# Patient Record
Sex: Male | Born: 1955 | Race: White | Hispanic: No | Marital: Married | Smoking: Former smoker
Health system: Southern US, Community
[De-identification: ages and names within clinical notes are randomized; demographics above are authoritative.]

## PROBLEM LIST (undated history)

## (undated) DIAGNOSIS — J449 Chronic obstructive pulmonary disease, unspecified: Secondary | ICD-10-CM

## (undated) DIAGNOSIS — M5126 Other intervertebral disc displacement, lumbar region: Secondary | ICD-10-CM

## (undated) HISTORY — PX: ROTATOR CUFF REPAIR: SHX139

## (undated) HISTORY — DX: Chronic obstructive pulmonary disease, unspecified: J44.9

## (undated) HISTORY — PX: SPINAL FUSION: SHX223

## (undated) HISTORY — PX: HERNIA REPAIR: SHX51

## (undated) HISTORY — DX: Other intervertebral disc displacement, lumbar region: M51.26

---

## 2004-08-25 ENCOUNTER — Ambulatory Visit (HOSPITAL_COMMUNITY): Admission: RE | Admit: 2004-08-25 | Discharge: 2004-08-25 | Payer: Self-pay | Admitting: Family Medicine

## 2004-10-02 ENCOUNTER — Encounter: Admission: RE | Admit: 2004-10-02 | Discharge: 2004-10-02 | Payer: Self-pay | Admitting: Neurological Surgery

## 2004-12-31 ENCOUNTER — Ambulatory Visit: Payer: Self-pay | Admitting: Pain Medicine

## 2005-01-01 ENCOUNTER — Ambulatory Visit: Payer: Self-pay | Admitting: Pain Medicine

## 2005-02-03 ENCOUNTER — Ambulatory Visit: Payer: Self-pay | Admitting: Physician Assistant

## 2010-08-09 ENCOUNTER — Encounter: Payer: Self-pay | Admitting: Family Medicine

## 2012-10-27 ENCOUNTER — Encounter: Payer: Self-pay | Admitting: Nurse Practitioner

## 2012-10-27 ENCOUNTER — Ambulatory Visit (INDEPENDENT_AMBULATORY_CARE_PROVIDER_SITE_OTHER): Payer: Medicare PPO | Admitting: Nurse Practitioner

## 2012-10-27 VITALS — BP 145/70 | HR 81 | Temp 97.6°F | Ht 74.0 in | Wt 227.0 lb

## 2012-10-27 DIAGNOSIS — R220 Localized swelling, mass and lump, head: Secondary | ICD-10-CM

## 2012-10-27 DIAGNOSIS — R221 Localized swelling, mass and lump, neck: Secondary | ICD-10-CM

## 2012-10-27 NOTE — Patient Instructions (Signed)
Keep appointment for CT when made Will talk after get results

## 2012-10-27 NOTE — Progress Notes (Signed)
  Subjective:    Patient ID: Don Davis, male    DOB: 12-11-1955, 57 y.o.   MRN: [id number]  HPI- Patient in today c/o lump in his throat. Noticed it in December. Can't see it everyday. Seems to have gotten bigger. Starting to have trouble swallowing solid foods. Liquids are ok. No change in voice. Patient quit smoking in 2012, was up to atleast 1 pck a day.   Review of Systems  Constitutional: Negative.   HENT: Positive for trouble swallowing. Negative for sore throat, mouth sores and voice change.   Eyes: Negative.   Respiratory: Positive for cough. Negative for chest tightness, shortness of breath and stridor.   Cardiovascular: Negative.   Gastrointestinal: Negative.   Genitourinary: Negative.   Musculoskeletal: Negative.   Neurological: Negative.   Hematological: Negative.   Psychiatric/Behavioral: Negative.        Objective:   Physical Exam  Constitutional: He is oriented to person, place, and time. He appears well-developed and well-nourished.  HENT:  Mouth/Throat: Oral lesions (undermeath tonue on right side. 1cm nontender) present.  Eyes: Pupils are equal, round, and reactive to light.  Neck: No tracheal deviation present. No thyromegaly present.  3cm mobile tender mass mid lower anterior neck  Cardiovascular: Normal rate, normal heart sounds and intact distal pulses.   Pulmonary/Chest: Effort normal and breath sounds normal.  Lymphadenopathy:    He has no cervical adenopathy.  Neurological: He is alert and oriented to person, place, and time.  Skin: Skin is warm and dry.  Psychiatric: He has a normal mood and affect. His behavior is normal. Judgment and thought content normal.    BP 145/70  Pulse 81  Temp(Src) 97.6 F (36.4 C) (Oral)  Ht 6\' 2"  (1.88 m)  Wt 227 lb (102.967 kg)  BMI 29.13 kg/m2       Assessment & Plan:  Anterior mid lower neck mass  CT of chest with contrast  Will wait on CT results  Mary-Margaret Daphine Deutscher, FNP

## 2012-11-02 ENCOUNTER — Other Ambulatory Visit: Payer: Self-pay | Admitting: Nurse Practitioner

## 2012-11-02 ENCOUNTER — Telehealth: Payer: Self-pay

## 2012-11-02 DIAGNOSIS — Z139 Encounter for screening, unspecified: Secondary | ICD-10-CM

## 2012-11-02 NOTE — Telephone Encounter (Signed)
Ordered CT chest with for neck mass  Passed prior authorization through [facility] but Maryruth Bun is saying that diagnosis will not pass medical necessity  They need another diagnosis

## 2012-11-02 NOTE — Telephone Encounter (Signed)
Guess will have to order CT of neck mass and hopefully that will go through. We will still have to go back and do Chest CT  eventually

## 2012-11-03 ENCOUNTER — Other Ambulatory Visit: Payer: Self-pay | Admitting: Nurse Practitioner

## 2012-11-03 ENCOUNTER — Other Ambulatory Visit (INDEPENDENT_AMBULATORY_CARE_PROVIDER_SITE_OTHER): Payer: Medicare PPO

## 2012-11-03 DIAGNOSIS — Z139 Encounter for screening, unspecified: Secondary | ICD-10-CM

## 2012-11-03 LAB — BASIC METABOLIC PANEL WITH GFR
BUN: 12 mg/dL (ref 6–23)
Chloride: 101 mEq/L (ref 96–112)
Creat: 0.89 mg/dL (ref 0.50–1.35)

## 2012-11-07 ENCOUNTER — Encounter: Payer: Medicare PPO | Admitting: Family Medicine

## 2012-11-07 ENCOUNTER — Ambulatory Visit (INDEPENDENT_AMBULATORY_CARE_PROVIDER_SITE_OTHER): Payer: Medicare PPO | Admitting: Family Medicine

## 2012-11-07 ENCOUNTER — Encounter: Payer: Self-pay | Admitting: Family Medicine

## 2012-11-07 ENCOUNTER — Encounter: Payer: Self-pay | Admitting: *Deleted

## 2012-11-07 VITALS — BP 164/84 | HR 72 | Temp 98.5°F | Ht 72.0 in | Wt 235.0 lb

## 2012-11-07 DIAGNOSIS — H1010 Acute atopic conjunctivitis, unspecified eye: Secondary | ICD-10-CM

## 2012-11-07 DIAGNOSIS — Z20818 Contact with and (suspected) exposure to other bacterial communicable diseases: Secondary | ICD-10-CM

## 2012-11-07 DIAGNOSIS — J302 Other seasonal allergic rhinitis: Secondary | ICD-10-CM | POA: Insufficient documentation

## 2012-11-07 DIAGNOSIS — Z2089 Contact with and (suspected) exposure to other communicable diseases: Secondary | ICD-10-CM

## 2012-11-07 DIAGNOSIS — H1045 Other chronic allergic conjunctivitis: Secondary | ICD-10-CM

## 2012-11-07 DIAGNOSIS — J309 Allergic rhinitis, unspecified: Secondary | ICD-10-CM

## 2012-11-07 DIAGNOSIS — J441 Chronic obstructive pulmonary disease with (acute) exacerbation: Secondary | ICD-10-CM

## 2012-11-07 MED ORDER — AMOXICILLIN 500 MG PO CAPS: 500.0000 mg | ORAL_CAPSULE | Freq: Three times a day (TID) | ORAL | Status: DC

## 2012-11-07 MED ORDER — BUDESONIDE-FORMOTEROL FUMARATE 160-4.5 MCG/ACT IN AERO: 2.0000 | INHALATION_SPRAY | Freq: Two times a day (BID) | RESPIRATORY_TRACT | Status: DC

## 2012-11-07 MED ORDER — CETIRIZINE HCL 10 MG PO TABS: 10.0000 mg | ORAL_TABLET | Freq: Every day | ORAL | Status: DC

## 2012-11-07 MED ORDER — OLOPATADINE HCL 0.1 % OP SOLN: 1.0000 [drp] | Freq: Two times a day (BID) | OPHTHALMIC | Status: DC

## 2012-11-07 NOTE — Progress Notes (Signed)
This encounter was created in error - please disregard.

## 2012-11-07 NOTE — Patient Instructions (Addendum)

## 2012-11-07 NOTE — Progress Notes (Signed)
Patient ID: Don Davis, male   DOB: January 25, 1956, 57 y.o.   MRN: [id number] SUBJECTIVE: HPI: Congested for:       has had runny and itchy eyes. has had runny, itchy and stuffy nose. has had Sneezing as well. has had Coughing has had Wheezing  Medications used for this problem:none has not had effective response. Has a history of former smoking with COPD and used to use a symbicort inhaler but ran out. Wheezy but not in distress. He had his grand daughter here last week seen with Dx strept throat.  PMH/PSH: reviewed/updated in Epic  SH/FH: reviewed/updated in Epic  Allergies: reviewed/updated in Epic  Medications: reviewed/updated in Epic  Immunizations: reviewed/updated in Epic  ROS: As above in the HPI. All other systems are stable or negative.  OBJECTIVE: APPEARANCE:  Patient in no acute distress.The patient appeared well nourished and normally developed. Acyanotic. Waist:obese VITAL SIGNS:BP 164/84  Pulse 72  Temp(Src) 98.5 F (36.9 C) (Oral)  Ht 6' (1.829 m)  Wt 235 lb (106.595 kg)  BMI 31.86 kg/m2 congested  SKIN: warm and  Dry without overt rashes, tattoos and scars  HEAD and Neck: without JVD, Head and scalp: normal Eyes:No scleral icterus. Fundi normal, eye movements normal.lacrimating. injected Ears: Auricle normal, canal normal, Tympanic membranes normal, insufflation normal. Nose:rhinorrhea and boggy swollen mucosa.Throat: normal Neck & thyroid: anterior neck mass, he is scheduled for a neck CT. Throat very rted without exudates.  CHEST & LUNGS: Chest wall: normal Lungs:rhonchi and wheezes. No rales.  CVS: Reveals the PMI to be normally located. Regular rhythm, First and Second Heart sounds are normal,  absence of murmurs, rubs or gallops. Peripheral vasculature: Radial pulses: normal Dorsal pedis pulses: normal Posterior pulses: normal  ABDOMEN:  Appearance: normal Benign,, no organomegaly, no masses, no Abdominal Aortic enlargement. No  Guarding , no rebound. No Bruits. Bowel sounds: normal  RECTAL: N/A GU: N/A  EXTREMETIES: nonedematous. Both Femoral and Pedal pulses are normal.  NEUROLOGIC: oriented to time,place and person; nonfocal.l.  ASSESSMENT: COPD exacerbation - Plan: budesonide-formoterol (SYMBICORT) 160-4.5 MCG/ACT inhaler  Seasonal allergic rhinitis - Plan: cetirizine (ZYRTEC) 10 MG tablet  Seasonal allergic conjunctivitis - Plan: olopatadine (PATANOL) 0.1 % ophthalmic solution  Strep throat exposure - Plan: amoxicillin (AMOXIL) 500 MG capsule    PLAN: No orders of the defined types were placed in this encounter.   No results found for this or any previous visit (from the past 24 hour(s)). Meds ordered this encounter  Medications  . cetirizine (ZYRTEC) 10 MG tablet    Sig: Take 1 tablet (10 mg total) by mouth daily.    Dispense:  30 tablet    Refill:  5  . olopatadine (PATANOL) 0.1 % ophthalmic solution    Sig: Place 1 drop into both eyes 2 (two) times daily.    Dispense:  5 mL    Refill:  1  . budesonide-formoterol (SYMBICORT) 160-4.5 MCG/ACT inhaler    Sig: Inhale 2 puffs into the lungs 2 (two) times daily.    Dispense:  1 Inhaler    Refill:  3  . amoxicillin (AMOXIL) 500 MG capsule    Sig: Take 1 capsule (500 mg total) by mouth 3 (three) times daily.    Dispense:  30 capsule    Refill:  0  avoid cigarette smoke. Saline gargles. Demonstrate use of the inhaler. Hand out on COPD. RTC in 4 weeks.  Madysin Crisp P. [place] Charon, M.D.

## 2012-11-08 NOTE — Progress Notes (Signed)
Patient came in for labs only.

## 2012-11-09 ENCOUNTER — Other Ambulatory Visit: Payer: Self-pay

## 2012-11-09 DIAGNOSIS — R221 Localized swelling, mass and lump, neck: Secondary | ICD-10-CM

## 2012-11-11 ENCOUNTER — Telehealth: Payer: Self-pay | Admitting: Nurse Practitioner

## 2012-11-11 NOTE — Telephone Encounter (Signed)
Patient aware not available yet will call when we can get it. PLEASE TRY AND GET REPORT THANK YOU?

## 2012-11-11 NOTE — Telephone Encounter (Signed)
Report not available yet. Have radilogy check on status. Let patient know will call as soon as get report

## 2012-11-11 NOTE — Telephone Encounter (Signed)
Please advise 

## 2012-11-16 NOTE — Telephone Encounter (Signed)
CT scan did not show [place] what looking for so we will schedule U/s of neck.

## 2012-11-16 NOTE — Telephone Encounter (Signed)
PT AWARE OF RESULTS AND AWARE THAT ULTRA SOUND IS BEING SCHEDULED.

## 2012-12-01 ENCOUNTER — Telehealth: Payer: Self-pay

## 2012-12-01 ENCOUNTER — Other Ambulatory Visit: Payer: Self-pay | Admitting: Nurse Practitioner

## 2012-12-01 DIAGNOSIS — R221 Localized swelling, mass and lump, neck: Secondary | ICD-10-CM

## 2012-12-01 NOTE — Telephone Encounter (Signed)
Says he is waiting on a referral for an ultrasound    nothing in referral workqueue

## 2012-12-06 ENCOUNTER — Other Ambulatory Visit: Payer: Self-pay | Admitting: Nurse Practitioner

## 2012-12-06 ENCOUNTER — Telehealth: Payer: Self-pay | Admitting: Nurse Practitioner

## 2012-12-06 ENCOUNTER — Ambulatory Visit (HOSPITAL_COMMUNITY)
Admission: RE | Admit: 2012-12-06 | Discharge: 2012-12-06 | Disposition: A | Discharge status: Home / Self Care | Payer: Medicare PPO | Source: Ambulatory Visit | Attending: Nurse Practitioner | Admitting: Nurse Practitioner

## 2012-12-06 DIAGNOSIS — E041 Nontoxic single thyroid nodule: Secondary | ICD-10-CM

## 2012-12-06 DIAGNOSIS — R221 Localized swelling, mass and lump, neck: Secondary | ICD-10-CM

## 2012-12-06 DIAGNOSIS — E049 Nontoxic goiter, unspecified: Secondary | ICD-10-CM | POA: Insufficient documentation

## 2012-12-06 DIAGNOSIS — R220 Localized swelling, mass and lump, head: Secondary | ICD-10-CM | POA: Insufficient documentation

## 2012-12-06 NOTE — Telephone Encounter (Signed)
LMOM, Note in chart for procedure says guided needle placement.

## 2012-12-08 ENCOUNTER — Ambulatory Visit (HOSPITAL_COMMUNITY)
Admission: RE | Admit: 2012-12-08 | Discharge: 2012-12-08 | Disposition: A | Discharge status: Home / Self Care | Payer: Medicare PPO | Source: Ambulatory Visit | Attending: Nurse Practitioner | Admitting: Nurse Practitioner

## 2012-12-08 DIAGNOSIS — E041 Nontoxic single thyroid nodule: Secondary | ICD-10-CM

## 2012-12-08 MED ORDER — LIDOCAINE HCL (PF) 2 % IJ SOLN
INTRAMUSCULAR | Status: AC
  Filled 2012-12-08: qty 10

## 2012-12-08 NOTE — Progress Notes (Signed)
Lidocaine 2%           2mL injected                 Right thyroid biopsies performed

## 2012-12-09 ENCOUNTER — Ambulatory Visit: Payer: Medicare PPO | Admitting: Family Medicine

## 2012-12-09 NOTE — Telephone Encounter (Signed)
Pt aware biopsy report not back yet

## 2012-12-13 ENCOUNTER — Telehealth: Payer: Self-pay | Admitting: Nurse Practitioner

## 2012-12-13 NOTE — Telephone Encounter (Signed)
Pt aware that pathology report not back yet  

## 2012-12-15 ENCOUNTER — Telehealth: Payer: Self-pay | Admitting: Nurse Practitioner

## 2012-12-15 NOTE — Telephone Encounter (Signed)
Pt aware that pathology report not back yet

## 2012-12-19 ENCOUNTER — Telehealth: Payer: Self-pay | Admitting: Nurse Practitioner

## 2012-12-19 DIAGNOSIS — E049 Nontoxic goiter, unspecified: Secondary | ICD-10-CM

## 2012-12-19 NOTE — Telephone Encounter (Signed)
Non-cancerous goiter- Will do endocrinology referral

## 2012-12-20 NOTE — Telephone Encounter (Signed)
Patient notified of results.

## 2013-05-25 ENCOUNTER — Other Ambulatory Visit: Payer: Self-pay

## 2014-06-21 ENCOUNTER — Telehealth: Payer: Self-pay | Admitting: Family Medicine

## 2015-05-22 ENCOUNTER — Encounter: Payer: Self-pay | Admitting: Pediatrics

## 2015-05-22 ENCOUNTER — Ambulatory Visit (INDEPENDENT_AMBULATORY_CARE_PROVIDER_SITE_OTHER): Payer: Medicare PPO | Admitting: Pediatrics

## 2015-05-22 VITALS — BP 141/81 | HR 86 | Temp 97.3°F | Ht 72.0 in | Wt 235.0 lb

## 2015-05-22 DIAGNOSIS — M545 Low back pain, unspecified: Secondary | ICD-10-CM | POA: Insufficient documentation

## 2015-05-22 DIAGNOSIS — R030 Elevated blood-pressure reading, without diagnosis of hypertension: Secondary | ICD-10-CM | POA: Diagnosis not present

## 2015-05-22 DIAGNOSIS — J449 Chronic obstructive pulmonary disease, unspecified: Secondary | ICD-10-CM | POA: Insufficient documentation

## 2015-05-22 DIAGNOSIS — B029 Zoster without complications: Secondary | ICD-10-CM | POA: Insufficient documentation

## 2015-05-22 DIAGNOSIS — R1012 Left upper quadrant pain: Secondary | ICD-10-CM | POA: Insufficient documentation

## 2015-05-22 DIAGNOSIS — Z720 Tobacco use: Secondary | ICD-10-CM | POA: Diagnosis not present

## 2015-05-22 DIAGNOSIS — IMO0001 Reserved for inherently not codable concepts without codable children: Secondary | ICD-10-CM | POA: Insufficient documentation

## 2015-05-22 LAB — POCT URINALYSIS DIPSTICK
BILIRUBIN UA: NEGATIVE
GLUCOSE UA: NEGATIVE
KETONES UA: NEGATIVE
LEUKOCYTES UA: NEGATIVE
Nitrite, UA: NEGATIVE
Spec Grav, UA: >=1.03
Urobilinogen, UA: NEGATIVE
pH, UA: 5

## 2015-05-22 LAB — POCT UA - MICROSCOPIC ONLY
Casts, Ur, LPF, POC: NEGATIVE
Crystals, Ur, HPF, POC: NEGATIVE
MUCUS UA: NEGATIVE
RBC, URINE, MICROSCOPIC: NEGATIVE
YEAST UA: NEGATIVE

## 2015-05-22 MED ORDER — BUDESONIDE-FORMOTEROL FUMARATE 160-4.5 MCG/ACT IN AERO: 2.0000 | INHALATION_SPRAY | Freq: Two times a day (BID) | RESPIRATORY_TRACT | Status: DC

## 2015-05-22 MED ORDER — VALACYCLOVIR HCL 1 G PO TABS: 1000.0000 mg | ORAL_TABLET | Freq: Three times a day (TID) | ORAL | Status: DC

## 2015-05-22 MED ORDER — NAPROXEN DR 500 MG PO TBEC: 500.0000 mg | DELAYED_RELEASE_TABLET | Freq: Two times a day (BID) | ORAL | Status: DC | PRN

## 2015-05-22 MED ORDER — ALBUTEROL SULFATE HFA 108 (90 BASE) MCG/ACT IN AERS: 2.0000 | INHALATION_SPRAY | Freq: Four times a day (QID) | RESPIRATORY_TRACT | Status: AC | PRN

## 2015-05-22 NOTE — Progress Notes (Signed)
Subjective:    Patient ID: Don Davis, male    DOB: 07-Mar-1956, 59 y.o.   MRN: [id number]  CC: abd pain, back pain, rash  HPI: Don Davis is a 59 y.o. male presenting on 05/22/2015 for Rash  Seen in the ED 4 days ago for LLQ/LUQ pain that started apprx 1 week ago after lifting a heavy hitch and feeling like he hurt his back again at the time. ED gave him miralax for constipation they saw on xray. Had clear stools after taking it. No change in abdominal pain. Pain goes away at times, he can make pain come back by bending over and overusing/stretching his back. Naproxen helps with pain.  Rash started 2 days ago on skin over LLQ. Hurts and burns. Has "water blisters" on the rash. No history of shingles. No dysuria. H/o kidney stones years ago, daughter also with kidney stones. No fevers Normal appetite, pain not worsened with eating Normal stooling since stopping the miralax, had two this morning. No CP, no SOB. Current smoker.  Relevant past medical, surgical, family and social history reviewed and updated as indicated. Interim medical history since our last visit reviewed. Allergies and medications reviewed and updated.   ROS: Per HPI unless specifically indicated above  Past Medical History Patient Active Problem List   Diagnosis Date Noted  . COPD exacerbation ([place]) 11/07/2012  . Seasonal allergic rhinitis 11/07/2012  . Seasonal allergic conjunctivitis 11/07/2012    Current Outpatient Prescriptions  Medication Sig Dispense Refill  . budesonide-formoterol (SYMBICORT) 160-4.5 MCG/ACT inhaler Inhale 2 puffs into the lungs 2 (two) times daily. 1 Inhaler 3  . NAPROXEN DR 500 MG EC tablet Take 1 tablet (500 mg total) by mouth 2 (two) times daily as needed. 30 tablet 0  . albuterol (PROVENTIL HFA;VENTOLIN HFA) 108 (90 BASE) MCG/ACT inhaler Inhale 2 puffs into the lungs every 6 (six) hours as needed for wheezing or shortness of breath. 1 Inhaler 2  . GAVILYTE-G 236 G  solution take as directed by prescriber  0  . valACYclovir (VALTREX) 1000 MG tablet Take 1 tablet (1,000 mg total) by mouth 3 (three) times daily. 21 tablet 0   No current facility-administered medications for this visit.       Objective:    BP 141/81 mmHg  Pulse 86  Temp(Src) 97.3 F (36.3 C) (Oral)  Ht 6' (1.829 m)  Wt 235 lb (106.595 kg)  BMI 31.86 kg/m2  Wt Readings from Last 3 Encounters:  05/22/15 235 lb (106.595 kg)  11/07/12 235 lb (106.595 kg)  11/07/12 235 lb (106.595 kg)    Gen: NAD, alert, cooperative with exam, NCAT EYES: EOMI, no scleral injection or icterus ENT:  TMs pearly gray b/l, OP without erythema LYMPH: no cervical LAD CV: NRRR, normal S1/S2, no murmur, distal pulses 2+ b/l Resp: CTABL, no wheezes, normal WOB Abd: +BS, soft, mildly tender LUQ of abdomen over rash, mildly distended, no guarding or organomegaly Ext: No edema, warm Neuro: Alert and oriented, strength equal b/l UE and LE, coordination grossly normal MSK: normal muscle bulk Skin: 7cm by 3cm red plaque with clear-fluid filled vesicles present just to L side of spine. Two additional smaller but similar plaques along same latitude of abdomen in mid-axillary line and anterior L side of abdomen. Small amount of redness present as well. No open sores, no excoriations.     Assessment & Plan:   Justinian was seen today for rash and follow up multiple medical complaints. Suspect that  back pain from chronic LBP is separate issue from painful shingles rash on skin. Pain improves with naprosyn. Normal eating/bowel habits now. Ddx broad but includes diverticulitis, kidney stone. Pt with no fevers, otherwise feeling well. Difficult to separate abd pain from shingles pain as is in the same location and may be the same thing. Will treat shingles as within 72 h of start of symptoms, also pt's wife with MS likely on immunosuppressants though husband not entirely sure. Discussed safety precautions around wife. Also  repeat UA, negative in ED per pt report. Return precautions givne. BP elevated today, will recheck next visit in 4 weeks. Pt needs refills on symbicort and albuterol for COPD. Symptoms currently well-controlled. Current smoker, not interested in quitting at this time. Continue naprosyn BID for back pain as needed.  Diagnoses and all orders for this visit:  Left upper quadrant pain -     POCT UA - Microscopic Only -     POCT urinalysis dipstick  Shingles -     valACYclovir (VALTREX) 1000 MG tablet; Take 1 tablet (1,000 mg total) by mouth 3 (three) times daily.  Chronic obstructive pulmonary disease, unspecified COPD type (HCC) -     budesonide-formoterol (SYMBICORT) 160-4.5 MCG/ACT inhaler; Inhale 2 puffs into the lungs 2 (two) times daily. -     albuterol (PROVENTIL HFA;VENTOLIN HFA) 108 (90 BASE) MCG/ACT inhaler; Inhale 2 puffs into the lungs every 6 (six) hours as needed for wheezing or shortness of breath.  Elevated blood pressure  Left-sided low back pain without sciatica  -     NAPROXEN DR 500 MG EC tablet; Take 1 tablet (500 mg total) by mouth 2 (two) times daily as needed.  Tobacco abuse  Follow up plan: Return in about 4 weeks (around 06/19/2015).  Assunta Found, MD [facility] 05/22/2015, 8:32 AM

## 2015-06-19 ENCOUNTER — Ambulatory Visit: Payer: Medicare PPO | Admitting: Pediatrics

## 2015-06-20 ENCOUNTER — Ambulatory Visit: Payer: Medicare PPO | Admitting: Pediatrics

## 2015-06-21 ENCOUNTER — Encounter: Payer: Self-pay | Admitting: Nurse Practitioner

## 2015-07-11 ENCOUNTER — Encounter: Payer: Self-pay | Admitting: Family Medicine

## 2015-07-11 ENCOUNTER — Ambulatory Visit (INDEPENDENT_AMBULATORY_CARE_PROVIDER_SITE_OTHER): Payer: Medicare PPO | Admitting: Family Medicine

## 2015-07-11 ENCOUNTER — Ambulatory Visit (INDEPENDENT_AMBULATORY_CARE_PROVIDER_SITE_OTHER): Payer: Medicare PPO

## 2015-07-11 DIAGNOSIS — M25551 Pain in right hip: Secondary | ICD-10-CM

## 2015-07-11 DIAGNOSIS — M25561 Pain in right knee: Secondary | ICD-10-CM

## 2015-07-11 MED ORDER — HYDROCODONE-ACETAMINOPHEN 10-325 MG PO TABS: 1.0000 | ORAL_TABLET | Freq: Three times a day (TID) | ORAL | Status: DC | PRN

## 2015-07-11 MED ORDER — CYCLOBENZAPRINE HCL 5 MG PO TABS: 5.0000 mg | ORAL_TABLET | Freq: Three times a day (TID) | ORAL | Status: DC | PRN

## 2015-07-11 MED ORDER — NAPROXEN 500 MG PO TBEC
500.0000 mg | DELAYED_RELEASE_TABLET | Freq: Two times a day (BID) | ORAL | Status: DC
Start: 2015-07-11 — End: 2017-02-19

## 2015-07-11 NOTE — Patient Instructions (Signed)
   Great to meet you!  Take naproxen twice daily for 5-7 days Use flexeril 3 times daily as needed  Hydrocodone is a potent pain medication, do not drive after you take it.  It is reserved for severe pain  Use ice for the first 24-36 hours, then heat is best  come back in 2-3 weeks
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
## 2015-08-05 ENCOUNTER — Telehealth: Payer: Self-pay | Admitting: Nurse Practitioner

## 2015-09-03 ENCOUNTER — Ambulatory Visit (INDEPENDENT_AMBULATORY_CARE_PROVIDER_SITE_OTHER): Payer: Medicare PPO

## 2015-09-03 ENCOUNTER — Ambulatory Visit (INDEPENDENT_AMBULATORY_CARE_PROVIDER_SITE_OTHER): Payer: Medicare PPO | Admitting: Family Medicine

## 2015-09-03 ENCOUNTER — Encounter: Payer: Self-pay | Admitting: Family Medicine

## 2015-09-03 VITALS — BP 147/77 | HR 91 | Temp 97.3°F | Ht 73.83 in | Wt 244.8 lb

## 2015-09-03 DIAGNOSIS — H66001 Acute suppurative otitis media without spontaneous rupture of ear drum, right ear: Secondary | ICD-10-CM | POA: Diagnosis not present

## 2015-09-03 DIAGNOSIS — M25551 Pain in right hip: Secondary | ICD-10-CM | POA: Diagnosis not present

## 2015-09-03 DIAGNOSIS — Z720 Tobacco use: Secondary | ICD-10-CM | POA: Diagnosis not present

## 2015-09-03 DIAGNOSIS — H66009 Acute suppurative otitis media without spontaneous rupture of ear drum, unspecified ear: Secondary | ICD-10-CM | POA: Insufficient documentation

## 2015-09-03 MED ORDER — CYCLOBENZAPRINE HCL 5 MG PO TABS: 5.0000 mg | ORAL_TABLET | Freq: Three times a day (TID) | ORAL | Status: DC | PRN

## 2015-09-03 MED ORDER — HYDROCODONE-ACETAMINOPHEN 7.5-325 MG PO TABS: 1.0000 | ORAL_TABLET | Freq: Four times a day (QID) | ORAL | Status: DC | PRN

## 2015-09-03 MED ORDER — AMOXICILLIN 875 MG PO TABS: 875.0000 mg | ORAL_TABLET | Freq: Two times a day (BID) | ORAL | Status: DC
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
## 2015-09-03 NOTE — Patient Instructions (Signed)
Great to meet you!  Use the Hydrocodone sparingly  We will call Thursday morning to set up an appt with Dr. Wynelle Link

## 2015-09-18 ENCOUNTER — Ambulatory Visit (INDEPENDENT_AMBULATORY_CARE_PROVIDER_SITE_OTHER): Payer: Medicare PPO | Admitting: Family Medicine

## 2015-09-18 ENCOUNTER — Encounter: Payer: Self-pay | Admitting: Family Medicine

## 2015-09-18 VITALS — BP 139/86 | HR 85 | Temp 97.8°F | Ht 73.83 in | Wt 245.6 lb

## 2015-09-18 DIAGNOSIS — R079 Chest pain, unspecified: Secondary | ICD-10-CM

## 2015-09-18 MED ORDER — GI COCKTAIL ~~LOC~~
30.0000 mL | Freq: Once | ORAL | Status: AC
  Administered 2015-09-18: 30 mL via ORAL

## 2015-09-18 MED ORDER — CEFDINIR 300 MG PO CAPS: 300.0000 mg | ORAL_CAPSULE | Freq: Two times a day (BID) | ORAL | Status: DC

## 2015-09-18 NOTE — Progress Notes (Signed)
   HPI  Patient presents today here for continued ear pain.  On the way into the clinic he developed burning upper abdominal pain and upper chest pain. He denies any shortness of breath,. He's been given a GI cocktail and his symptoms are beginning to resolve. He states at home he takes Alka-Seltzer for similar pain and it goes away. Non radiating No palps Non exertional  Right ear pain He was seen about 2 weeks ago for right ear pain and treated with amoxicillin for acute otitis media He did have good improvement that has had return of symptoms with right-sided sharp ear pain intermittently. He's also had unusual popping is years.    PMH: Smoking status noted ROS: Per HPI  Objective: BP 139/86 mmHg  Pulse 85  Temp(Src) 97.8 F (36.6 C) (Oral)  Ht 6' 1.83" (1.875 m)  Wt 245 lb 9.6 oz (111.403 kg)  BMI 31.69 kg/m2 Gen: NAD, alert, cooperative with exam HEENT: NCAT, right TM with small red scab at the base, cement surrounding erythema as well, left TM is more erythematous but still has landmarks with thick yellow refill above the ossicles CV: RRR, good S1/S2, no murmur Resp: CTABL, no wheezes, non-labored Ext: No edema, warm Neuro: Alert and oriented, No gross deficits  EKG normal sinus rhythm  Assessment and plan:  # Chest pain, GERD Epigastric abdominal pain, burning in nature Resolved with GI cocktail Likely GERD related symptoms, EKG normal today  # Otitis media, right-sided symptoms, left-sided findings Treat with Omnicef considering recent amoxicillin does Discussed possible ENT referral, he will return if not improved in 2 weeks   Laroy Apple, MD [facility] 09/18/2015, 5:27 PM

## 2015-09-18 NOTE — Patient Instructions (Signed)
Great to see you!  Take all antibiotics  Come back in 2 weeks if not better, sooner if you need [place] for any reason.   Lets plan to see you for a routine exam in 2-3 months

## 2015-09-18 NOTE — Addendum Note (Signed)
Addended by: Karle Plumber on: 09/18/2015 05:45 PM   Modules accepted: Orders

## 2015-10-21 ENCOUNTER — Telehealth: Payer: Self-pay | Admitting: Family Medicine

## 2015-10-21 MED ORDER — IVERMECTIN 0.5 % EX LOTN: TOPICAL_LOTION | CUTANEOUS | Status: DC

## 2015-10-21 NOTE — Telephone Encounter (Signed)
Patient aware.

## 2015-10-21 NOTE — Telephone Encounter (Signed)
Sent Sklice for lice.   Laroy Apple, MD [facility] 10/21/2015, 12:31 PM

## 2015-10-24 ENCOUNTER — Telehealth: Payer: Self-pay

## 2015-10-24 MED ORDER — PERMETHRIN 1 % EX LOTN: TOPICAL_LOTION | CUTANEOUS | Status: DC

## 2015-10-24 NOTE — Telephone Encounter (Signed)
Rx changed  Laroy Apple, MD [facility] 10/24/2015, 12:08 PM

## 2015-10-24 NOTE — Telephone Encounter (Signed)
Insurance denied [employer]  Preferred are Permethrin topical cream and malathion lotion

## 2016-06-16 ENCOUNTER — Ambulatory Visit (INDEPENDENT_AMBULATORY_CARE_PROVIDER_SITE_OTHER): Payer: Medicare PPO | Admitting: Family Medicine

## 2016-06-16 ENCOUNTER — Encounter: Payer: Self-pay | Admitting: Family Medicine

## 2016-06-16 VITALS — BP 151/91 | HR 93 | Temp 97.6°F | Ht 73.83 in | Wt 235.6 lb

## 2016-06-16 DIAGNOSIS — E042 Nontoxic multinodular goiter: Secondary | ICD-10-CM

## 2016-06-16 DIAGNOSIS — M542 Cervicalgia: Secondary | ICD-10-CM

## 2016-06-16 DIAGNOSIS — R911 Solitary pulmonary nodule: Secondary | ICD-10-CM

## 2016-06-16 DIAGNOSIS — N889 Noninflammatory disorder of cervix uteri, unspecified: Secondary | ICD-10-CM | POA: Insufficient documentation

## 2016-06-16 MED ORDER — CYCLOBENZAPRINE HCL 5 MG PO TABS: 5.0000 mg | ORAL_TABLET | Freq: Three times a day (TID) | ORAL | 1 refills | Status: DC | PRN

## 2016-06-16 MED ORDER — METHYLPREDNISOLONE ACETATE 80 MG/ML IJ SUSP
80.0000 mg | Freq: Once | INTRAMUSCULAR | Status: AC
  Administered 2016-06-16: 80 mg via INTRAMUSCULAR

## 2016-06-16 MED ORDER — HYDROCODONE-ACETAMINOPHEN 7.5-325 MG PO TABS: 1.0000 | ORAL_TABLET | Freq: Four times a day (QID) | ORAL | 0 refills | Status: DC | PRN

## 2016-06-16 NOTE — Patient Instructions (Signed)
Great to see you!  For neck pain and the bone "lucency" on your CT scan I have ordered an MRI. We will refer you to neurosurgery as warranted   For the pulmonary nodule, you need a repeat CT scan of the chest in 12 month to be sure it is not changing like cancer  For the thyroid nodule you either need an [place] ([facility] or [facility]) or follow up at the [place] [facility] to be sure they can compare the findings to your previous tests

## 2016-06-16 NOTE — Progress Notes (Signed)
HPI  Patient presents today with left neck pain.  Patient explains these have problems with neck pain off and on for years. He was told by neurosurgeon, Dr. Carloyn Manner, previously that he needed a spinal fusion of the cervical spine. He previously was not having as many problems and so has declined this up until this point. Over the last 5 days he's had severe left-sided neck pain with radiation down to his left fingertips and numbness and tingling of his left arm. He has preserved strength and preserved function of the left arm.  He was seen at the [facility] yesterday and his heart was evaluated with a EKG, this was reported to be totally normal. His symptoms are not exertional, he has worsening symptoms with looking to the left or bending his neck.  Patient is requesting narcotic pain medications Prednisone does help his pain in general.  He would like a referrla to a neurosurgeon    PMH: Smoking status noted ROS: Per HPI  Objective: BP (!) 151/91   Pulse 93   Temp 97.6 F (36.4 C) (Oral)   Ht 6' 1.83" (1.875 m)   Wt 235 lb 9.6 oz (106.9 kg)   BMI 30.39 kg/m  Gen: NAD, alert, cooperative with exam HEENT: NCAT, EOMI, PERRL CV: RRR, good S1/S2, no murmur Resp: CTABL, no wheezes, non-labored Abd: SNTND, BS present, no guarding or organomegaly Ext: No edema, warm Neuro: Alert and oriented, No gross deficits  Has a CT of the mandible and cervical spine from a recent visit to the [place], collected on 06/02/2016. This shows 1. Symmetric prominence/mild enlargement of the bilateral parotid glands and submandibular glands. This may be secondary to normal anatomic variation versus reactive inflammatory change, no cervical mass or lesion, no cervical lymphadenopathy 2. Pulmonary emphysema and a small 3 mm left apical pulmonary nodule identified. Follow-up dedicated thoracic CT suggested in 12 months 3. Small nonspecific lucent focus of the C7 vertebral body. Correlate with dedicated cervical spine  MRI and for symptoms of pain in the lower cervical spine. 4. Bilateral thyroid nodules with soft tissue extension of the upper right thyroid lobe. Consider dedicated thyroid ultrasound if not previously obtained 5. Lucency surrounds what appears to be fragment of the left mandibular tooth. Periapical dental cyst or abscess not excluded  Assessment and plan:  # Cervical lesion, acute on chronic left-sided neck pain Patient with severe radiculopathy IM Depo-Medrol given, 30 pills of 7.5 mg hydrocodone also given MRI of the neck to investigate further both radiculopathy and cervical lucency Refer to neurosurgery pending results  # Olivary pulmonary nodule Left-sided 3 mm apical nodule, discussed with patient the need for repeat CT scan in 12 months, he will be sure to [facility].  # Thyroid nodules Previous thyroid ultrasound and biopsy in 2014 diagnosed with nonneoplastic goiter Considering extension of the right upper thyroid I would recommend repeat ultrasound when the dust settles from this neck issue here and he is also undergoing an MRI which could characterize the thyroid gland incidentally. recommended follow-up with myself or the [place].     Orders Placed This Encounter  Procedures  . MR Cervical Spine Wo Contrast    Standing Status:   Future    Standing Expiration Date:   08/16/2017    Order Specific Question:   Reason for Exam (SYMPTOM  OR DIAGNOSIS REQUIRED)    Answer:   acute on chronic neck pain with L sided radiculopathy, C7 Bone lucency  on recent CT    Order Specific Question:   Preferred imaging location?    Answer:   [facility] (table limit-350lbs)    Order Specific Question:   What is the patient's sedation requirement?    Answer:   No Sedation    Order Specific Question:   Does the patient have a pacemaker or implanted devices?    Answer:   No    Meds ordered this encounter  Medications  . HYDROcodone-acetaminophen  (NORCO) 7.5-325 MG tablet    Sig: Take 1 tablet by mouth every 6 (six) hours as needed for moderate pain.    Dispense:  30 tablet    Refill:  0  . cyclobenzaprine (FLEXERIL) 5 MG tablet    Sig: Take 1 tablet (5 mg total) by mouth 3 (three) times daily as needed for muscle spasms.    Dispense:  60 tablet    Refill:  [place], MD [facility] 06/16/2016, 1:24 PM

## 2016-06-18 ENCOUNTER — Telehealth: Payer: Self-pay | Admitting: Family Medicine

## 2016-06-18 NOTE — Telephone Encounter (Signed)
Spoke with pt regarding MRI Waiting for approval from [facility]

## 2016-06-24 ENCOUNTER — Ambulatory Visit (HOSPITAL_COMMUNITY)
Admission: RE | Admit: 2016-06-24 | Discharge: 2016-06-24 | Disposition: A | Discharge status: Home / Self Care | Payer: Medicare PPO | Source: Ambulatory Visit | Attending: Family Medicine | Admitting: Family Medicine

## 2016-06-24 DIAGNOSIS — M50322 Other cervical disc degeneration at C5-C6 level: Secondary | ICD-10-CM | POA: Diagnosis not present

## 2016-06-24 DIAGNOSIS — M542 Cervicalgia: Secondary | ICD-10-CM | POA: Diagnosis present

## 2016-06-24 DIAGNOSIS — N889 Noninflammatory disorder of cervix uteri, unspecified: Secondary | ICD-10-CM

## 2016-06-24 DIAGNOSIS — M4802 Spinal stenosis, cervical region: Secondary | ICD-10-CM | POA: Insufficient documentation

## 2016-06-25 ENCOUNTER — Ambulatory Visit (INDEPENDENT_AMBULATORY_CARE_PROVIDER_SITE_OTHER): Payer: Medicare PPO | Admitting: Family Medicine

## 2016-06-25 ENCOUNTER — Encounter: Payer: Self-pay | Admitting: Family Medicine

## 2016-06-25 VITALS — BP 145/87 | HR 86 | Temp 99.0°F | Ht 73.83 in | Wt 235.8 lb

## 2016-06-25 DIAGNOSIS — M9981 Other biomechanical lesions of cervical region: Secondary | ICD-10-CM

## 2016-06-25 DIAGNOSIS — M4802 Spinal stenosis, cervical region: Secondary | ICD-10-CM

## 2016-06-25 MED ORDER — HYDROCODONE-ACETAMINOPHEN 10-325 MG PO TABS: 1.0000 | ORAL_TABLET | Freq: Three times a day (TID) | ORAL | 0 refills | Status: DC | PRN

## 2016-06-25 NOTE — Telephone Encounter (Signed)
MRI completed.

## 2016-06-25 NOTE — Progress Notes (Signed)
   HPI  Patient presents today  for lip pain.  Patient explains that he has severe left-sided neck pain that radiates from his left neck to his left hand. The pain runs along his extensor surface of his forearm down to his thumb and second and third digit.  He has normal strength for his report. He would like to consider surgery and request referral.  7.5 mg hydrocodone are helping his pain but he feels like 10 mg very much better for him.   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     Orders Placed This Encounter  Procedures  . Ambulatory referral to Neurosurgery    Referral Priority:   Routine    Referral Type:   Surgical    Referral Reason:   Specialty Services Required    Requested Specialty:   Neurosurgery    Number of Visits Requested:   1    Meds ordered this encounter  Medications  . HYDROcodone-acetaminophen (NORCO) 10-325 MG tablet    Sig: Take 1 tablet by mouth every 8 (eight) hours as needed.    Dispense:  30 tablet    Refill:  0    Laroy Apple, MD [facility] 06/25/2016, 8:10 AM

## 2016-06-25 NOTE — Patient Instructions (Signed)
Great to see you!  We will call with an appointment

## 2016-07-28 ENCOUNTER — Ambulatory Visit (INDEPENDENT_AMBULATORY_CARE_PROVIDER_SITE_OTHER): Payer: Medicare HMO | Admitting: Family Medicine

## 2016-07-28 ENCOUNTER — Encounter: Payer: Self-pay | Admitting: Family Medicine

## 2016-07-28 VITALS — BP 140/80 | HR 89 | Temp 97.0°F | Ht 73.83 in | Wt 238.6 lb

## 2016-07-28 DIAGNOSIS — M542 Cervicalgia: Secondary | ICD-10-CM | POA: Diagnosis not present

## 2016-07-28 MED ORDER — HYDROCODONE-ACETAMINOPHEN 10-325 MG PO TABS: 1.0000 | ORAL_TABLET | Freq: Three times a day (TID) | ORAL | 0 refills | Status: DC | PRN

## 2016-07-28 MED ORDER — CYCLOBENZAPRINE HCL 5 MG PO TABS: 5.0000 mg | ORAL_TABLET | Freq: Three times a day (TID) | ORAL | 1 refills | Status: AC | PRN

## 2016-07-28 NOTE — Progress Notes (Signed)
   HPI  Patient presents today here for follow up with neck pain  Patient has seen Dr. Carloyn Manner in the interim, he is considering cervical neck fusion. However he does have reservations about the type of fusion that's offered. He is doing research and is considering a second opinion.  Patient has good relief with hydrocodone, Naprosyn, Flexeril. Needs a refill of hydrocodone and Flexeril.  Controlled substance database is reviewed with no red flags.  Patient continues to have bilateral arm numbness which is stable, also neck pain. It's worse with activity.  PMH: Smoking status noted ROS: Per HPI  Objective: BP 140/80   Pulse 89   Temp 97 F (36.1 C) (Oral)   Ht 6' 1.83" (1.875 m)   Wt 238 lb 9.6 oz (108.2 kg)   BMI 30.78 kg/m  Gen: NAD, alert, cooperative with exam HEENT: NCAT CV: RRR, good S1/S2, no murmur Resp: CTABL, no wheezes, non-labored Ext: No edema, warm Neuro: Alert and oriented  Assessment and plan:  # Neck pain Patient has seen neurosurgery, he is considering surgery, also considering second opinion Refilled hydrocodone Discussed that if this is long-term he will likely need to go to pain management. Also refilled Flexeril which is helping quite a bit. Patient also getting benefit from naproxen.    Meds ordered this encounter  Medications  . HYDROcodone-acetaminophen (NORCO) 10-325 MG tablet    Sig: Take 1 tablet by mouth every 8 (eight) hours as needed.    Dispense:  30 tablet    Refill:  0  . cyclobenzaprine (FLEXERIL) 5 MG tablet    Sig: Take 1 tablet (5 mg total) by mouth 3 (three) times daily as needed for muscle spasms.    Dispense:  60 tablet    Refill:  [place], MD [facility] 07/28/2016, 1:36 PM

## 2016-07-28 NOTE — Patient Instructions (Signed)
Great to see you!  Use the hydrocodone sparingly, only as needed

## 2016-11-17 DIAGNOSIS — G5711 Meralgia paresthetica, right lower limb: Secondary | ICD-10-CM | POA: Diagnosis not present

## 2016-11-17 DIAGNOSIS — M47816 Spondylosis without myelopathy or radiculopathy, lumbar region: Secondary | ICD-10-CM | POA: Diagnosis not present

## 2016-11-17 DIAGNOSIS — M5412 Radiculopathy, cervical region: Secondary | ICD-10-CM | POA: Diagnosis not present

## 2016-12-29 ENCOUNTER — Telehealth: Payer: Self-pay | Admitting: Family Medicine

## 2017-01-14 ENCOUNTER — Encounter: Payer: Self-pay | Admitting: Family Medicine

## 2017-01-14 ENCOUNTER — Ambulatory Visit (INDEPENDENT_AMBULATORY_CARE_PROVIDER_SITE_OTHER): Payer: Medicare HMO | Admitting: Family Medicine

## 2017-01-14 ENCOUNTER — Ambulatory Visit (INDEPENDENT_AMBULATORY_CARE_PROVIDER_SITE_OTHER): Payer: Medicare HMO

## 2017-01-14 VITALS — BP 132/83 | HR 110 | Temp 97.5°F | Ht 73.83 in | Wt 236.0 lb

## 2017-01-14 DIAGNOSIS — R221 Localized swelling, mass and lump, neck: Secondary | ICD-10-CM

## 2017-01-14 DIAGNOSIS — L732 Hidradenitis suppurativa: Secondary | ICD-10-CM

## 2017-01-14 DIAGNOSIS — R509 Fever, unspecified: Secondary | ICD-10-CM | POA: Diagnosis not present

## 2017-01-14 MED ORDER — DOXYCYCLINE HYCLATE 100 MG PO TABS: 100.0000 mg | ORAL_TABLET | Freq: Two times a day (BID) | ORAL | 0 refills | Status: DC

## 2017-01-14 NOTE — Patient Instructions (Signed)
Great to see you!  Come back in 1-2 weeks if you are not getting better as expected.   I am concerned about the mass on you neck, I owuld like you to be seen at the [facility] for it because I think you need advanced imaging for it.

## 2017-01-14 NOTE — Progress Notes (Signed)
   HPI  Patient presents today here with fever.  Patient explains that he's had fever, chills, feeling bad for about 2 or 3 weeks, however her last 4 days he has had measured fever, works as high as 104.5. For about 4 days he's had lumps under his bilateral axilla For 9 days he's had a lump on the left posterior neck just next to the spine.  Patient states that the lumps under his arm are tender. He denies any chest pain. He has had shortness of breath recently.  PMH: Smoking status noted ROS: Per HPI  Objective: BP 132/83   Pulse (!) 110   Temp 97.5 F (36.4 C) (Oral)   Ht 6' 1.83" (1.875 m)   Wt 236 lb (107 kg)   BMI 30.44 kg/m  Gen: NAD, alert, cooperative with exam HEENT: NCAT, EOMI, PERRL CV: RRR, good S1/S2, no murmur Resp: CTABL, no wheezes, non-labored Ext: No edema, warm Neuro: Alert and oriented, No gross deficits Skin:  Bilateral axilla with a single pea-sized superficial subcutaneous nodule that slightly tender to touch, with no erythema or induration Left-sided posterior neck mass measuring approximately 3 cm x 2 cm that's firm and thick feeling in the deep tissue located just left of C7/T1.   Assessment and plan:  # Fever, massive neck, hidradenitis Unusual constellation of symptoms. The mass in the posterior part of his neck is concerning, his sister died of lymphoma and that is certainly in the differential on my exam.  The patient would like to avoid any advanced imaging outside of the [place] I have started him on doxycycline, I have checked basic labs including Lyme and RMSF  I have recommended that the [facility] as soon as possible early next week if he is not seeing very good improvement in his symptoms.  X-ray of the cervical spine: Personal review-no abnormality in the area of concern, he does have severe disc space narrowing throughout the cervical spine    Orders Placed This Encounter  Procedures  . DG Cervical Spine  2 or 3 views    Standing Status:   Future    Number of Occurrences:   1    Standing Expiration Date:   01/14/2018    Order Specific Question:   Reason for Exam (SYMPTOM  OR DIAGNOSIS REQUIRED)    Answer:   soft Mass at C7/T1 to the L of spine,    Order Specific Question:   Preferred imaging location?    Answer:   Internal  . CBC with Differential/Platelet  . CMP14+EGFR  . Lyme Ab/Western Blot Reflex  . Rocky mtn spotted fvr abs pnl(IgG+IgM)    Meds ordered this encounter  Medications  . gabapentin (NEURONTIN) 300 MG capsule    Sig: Take 300 mg by mouth 3 (three) times daily.  Marland Kitchen doxycycline (VIBRA-TABS) 100 MG tablet    Sig: Take 1 tablet (100 mg total) by mouth 2 (two) times daily. 1 po bid    Dispense:  28 tablet    Refill:  0    Laroy Apple, MD [facility] 01/14/2017, 2:29 PM

## 2017-01-17 LAB — CMP14+EGFR
ALBUMIN: 4.4 g/dL (ref 3.6–4.8)
ALT: 19 IU/L (ref 0–44)
AST: 20 IU/L (ref 0–40)
Albumin/Globulin Ratio: 2.1 (ref 1.2–2.2)
Alkaline Phosphatase: 100 IU/L (ref 39–117)
BILIRUBIN TOTAL: 0.6 mg/dL (ref 0.0–1.2)
BUN / CREAT RATIO: 13 (ref 10–24)
BUN: 9 mg/dL (ref 8–27)
CHLORIDE: 102 mmol/L (ref 96–106)
CO2: 20 mmol/L (ref 20–29)
CREATININE: 0.69 mg/dL — AB (ref 0.76–1.27)
Calcium: 9.3 mg/dL (ref 8.6–10.2)
GFR calc non Af Amer: 101 mL/min/{1.73_m2} (ref >59)
GFR, EST AFRICAN AMERICAN: 117 mL/min/{1.73_m2} (ref >59)
GLUCOSE: 124 mg/dL — AB (ref 65–99)
Globulin, Total: 2.1 g/dL (ref 1.5–4.5)
Potassium: 3.9 mmol/L (ref 3.5–5.2)
Sodium: 141 mmol/L (ref 134–144)
Total Protein: 6.5 g/dL (ref 6.0–8.5)

## 2017-01-17 LAB — LYME AB/WESTERN BLOT REFLEX: LYME DISEASE AB, QUANT, IGM: <0.8 index (ref 0.00–0.79)

## 2017-01-17 LAB — CBC WITH DIFFERENTIAL/PLATELET
BASOS ABS: 0 10*3/uL (ref 0.0–0.2)
Basos: 0 %
EOS (ABSOLUTE): 0 10*3/uL (ref 0.0–0.4)
Eos: 1 %
HEMOGLOBIN: 16 g/dL (ref 13.0–17.7)
Hematocrit: 46 % (ref 37.5–51.0)
IMMATURE GRANS (ABS): 0.2 10*3/uL — AB (ref 0.0–0.1)
IMMATURE GRANULOCYTES: 5 %
LYMPHS ABS: 1.5 10*3/uL (ref 0.7–3.1)
LYMPHS: 32 %
MCH: 30.5 pg (ref 26.6–33.0)
MCHC: 34.8 g/dL (ref 31.5–35.7)
MCV: 88 fL (ref 79–97)
MONOCYTES: 16 %
Monocytes Absolute: 0.7 10*3/uL (ref 0.1–0.9)
NEUTROS PCT: 46 %
Neutrophils Absolute: 2.1 10*3/uL (ref 1.4–7.0)
PLATELETS: 130 10*3/uL — AB (ref 150–379)
RBC: 5.25 x10E6/uL (ref 4.14–5.80)
RDW: 14.8 % (ref 12.3–15.4)
WBC: 4.6 10*3/uL (ref 3.4–10.8)

## 2017-01-17 LAB — ROCKY MTN SPOTTED FVR ABS PNL(IGG+IGM)
RMSF IGG: NEGATIVE
RMSF IGM: 1.44 {index} — AB (ref 0.00–0.89)

## 2017-01-27 ENCOUNTER — Ambulatory Visit (INDEPENDENT_AMBULATORY_CARE_PROVIDER_SITE_OTHER): Payer: Medicare HMO

## 2017-01-27 ENCOUNTER — Ambulatory Visit (INDEPENDENT_AMBULATORY_CARE_PROVIDER_SITE_OTHER): Payer: Medicare HMO | Admitting: Family Medicine

## 2017-01-27 ENCOUNTER — Encounter: Payer: Self-pay | Admitting: Family Medicine

## 2017-01-27 VITALS — BP 139/63 | HR 100 | Temp 98.7°F | Ht 73.83 in | Wt 238.0 lb

## 2017-01-27 DIAGNOSIS — R0602 Shortness of breath: Secondary | ICD-10-CM

## 2017-01-27 DIAGNOSIS — R591 Generalized enlarged lymph nodes: Secondary | ICD-10-CM | POA: Diagnosis not present

## 2017-01-27 MED ORDER — PREDNISONE 20 MG PO TABS: ORAL_TABLET | ORAL | 0 refills | Status: DC

## 2017-01-27 MED ORDER — DOXYCYCLINE HYCLATE 100 MG PO TABS: 100.0000 mg | ORAL_TABLET | Freq: Two times a day (BID) | ORAL | 0 refills | Status: DC

## 2017-01-27 MED ORDER — FUROSEMIDE 20 MG PO TABS: 20.0000 mg | ORAL_TABLET | Freq: Every day | ORAL | 3 refills | Status: AC

## 2017-01-27 NOTE — Progress Notes (Signed)
BP 139/63   Pulse 100   Temp 98.7 F (37.1 C) (Oral)   Ht 6' 1.83" (1.875 m)   Wt 238 lb (108 kg)   SpO2 95%   BMI 30.70 kg/m    Subjective:    Patient ID: Don Davis, male    DOB: [birth year], 61 y.o.   MRN: [id number]  HPI: Don Davis is a 61 y.o. male presenting on 01/27/2017 for Adenopathy (pt here today c/o lumps in arm pits, neck, groin area, and arms.)   HPI Shortness of breath and lymphadenopathy Patient has multiple lymph nodes that are growing an increase in size in neck and axilla and parasternal and paraspinal and inguinal and one at the base of this penile shaft. He also complains of increased shortness of breath that has been giving him problems over the past month or 2. He was seen a few weeks ago by one of my colleagues and was recommended to get advanced imaging for the lymphadenopathy and treated with doxycycline which he is still on and will finish today. He feels like the shortness of breath and coughing and wheezing has only worsened since being on the doxycycline and at the doxycycline has not helped much. He denies any fevers or chills but does have increased shortness of breath and difficulty laying flat and shortness of breath on exertion. He also has these lymph nodes that are throughout his body that he is extremely concerned about.  Relevant past medical, surgical, family and social history reviewed and updated as indicated. Interim medical history since our last visit reviewed. Allergies and medications reviewed and updated.  Review of Systems  Constitutional: Negative for chills and fever.  HENT: Positive for congestion, sinus pain and sinus pressure. Negative for ear pain, sneezing and sore throat.   Eyes: Negative for discharge.  Respiratory: Positive for cough, shortness of breath and wheezing.   Cardiovascular: Negative for chest pain and leg swelling.  Skin: Negative for rash.  Neurological: Negative for dizziness and light-headedness.    Hematological: Positive for adenopathy.  All other systems reviewed and are negative.   Per HPI unless specifically indicated above        Objective:    BP 139/63   Pulse 100   Temp 98.7 F (37.1 C) (Oral)   Ht 6' 1.83" (1.875 m)   Wt 238 lb (108 kg)   SpO2 95%   BMI 30.70 kg/m   Wt Readings from Last 3 Encounters:  01/27/17 238 lb (108 kg)  01/14/17 236 lb (107 kg)  07/28/16 238 lb 9.6 oz (108.2 kg)    Physical Exam  Constitutional: He is oriented to person, place, and time. He appears well-developed and well-nourished. No distress.  HENT:  Right Ear: External ear normal.  Left Ear: External ear normal.  Nose: Nose normal.  Mouth/Throat: Oropharynx is clear and moist.  Eyes: Conjunctivae are normal. No scleral icterus.  Cardiovascular: Normal rate, regular rhythm, normal heart sounds and intact distal pulses.   No murmur heard. Pulmonary/Chest: Effort normal. No respiratory distress. He has wheezes. He has no rales. He exhibits no tenderness.  Musculoskeletal: Normal range of motion. He exhibits no edema.  Lymphadenopathy:    He has cervical adenopathy.       Right cervical: Superficial cervical adenopathy present.       Left cervical: Superficial cervical adenopathy present.    He has axillary adenopathy.       Right axillary: Lateral adenopathy present.  Right: Inguinal adenopathy present.  Patient has paraspinal lymphadenopathy about mid thoracic and a small nodule at the base of his penile shaft superiorly. Has right inguinal and parasternal lymphadenopathy as well  Neurological: He is alert and oriented to person, place, and time. Coordination normal.  Skin: Skin is warm and dry. No rash noted. He is not diaphoretic.  Psychiatric: He has a normal mood and affect. His behavior is normal.  Nursing note and vitals reviewed.   Chest x-ray: Congestion throughout, possible nodules, possible CHF as well. With history of multiple nodules, will send for CT  chest with contrast. Patient also has extensive history of smoking.    Assessment & Plan:   Problem List Items Addressed This Visit    None    Visit Diagnoses    Shortness of breath    -  Primary   Relevant Medications   predniSONE (DELTASONE) 20 MG tablet   furosemide (LASIX) 20 MG tablet   doxycycline (VIBRA-TABS) 100 MG tablet   Other Relevant Orders   DG Chest 2 View   CT Chest W Contrast   Lymphadenopathy       Patient has multiple lymph nodes through head and neck region and anterior chest and paraspinal and inguinal   Relevant Medications   doxycycline (VIBRA-TABS) 100 MG tablet   Other Relevant Orders   CT Chest W Contrast      Insurance is giving [place] some trouble with the CT chest, we will keep trying to go through the insurance approval and see if we can get it through tomorrow. He is going to try through the [place] system  Follow up plan: Return if symptoms worsen or fail to improve.  Counseling provided for all of the vaccine components Orders Placed This Encounter  Procedures  . DG Chest 2 View    Caryl Pina, MD [facility] 01/27/2017, 9:10 AM

## 2017-01-28 ENCOUNTER — Other Ambulatory Visit: Payer: Self-pay | Admitting: Family Medicine

## 2017-01-28 ENCOUNTER — Ambulatory Visit (HOSPITAL_COMMUNITY)
Admission: RE | Admit: 2017-01-28 | Discharge: 2017-01-28 | Disposition: A | Discharge status: Home / Self Care | Payer: Medicare HMO | Source: Ambulatory Visit | Attending: Family Medicine | Admitting: Family Medicine

## 2017-01-28 DIAGNOSIS — R0602 Shortness of breath: Secondary | ICD-10-CM | POA: Insufficient documentation

## 2017-01-28 DIAGNOSIS — R918 Other nonspecific abnormal finding of lung field: Secondary | ICD-10-CM | POA: Diagnosis not present

## 2017-01-28 DIAGNOSIS — R591 Generalized enlarged lymph nodes: Secondary | ICD-10-CM

## 2017-01-28 MED ORDER — IOPAMIDOL (ISOVUE-300) INJECTION 61%
75.0000 mL | Freq: Once | INTRAVENOUS | Status: AC | PRN
Start: 2017-01-28 — End: 2017-01-28
  Administered 2017-01-28: 75 mL via INTRAVENOUS

## 2017-01-28 NOTE — Progress Notes (Signed)
Discussed with patient in person about results of CT scan and multiple lymph nodes throughout chest and chest wall and neck and back. Patient is understanding and we made an appointment with him for surgery and have put in an urgent referral to oncology will see where we can get him to first. Caryl Pina, MD [facility] 01/28/2017, 5:18 PM

## 2017-01-29 ENCOUNTER — Telehealth: Payer: Self-pay | Admitting: Family Medicine

## 2017-01-29 MED ORDER — HYDROCODONE-IBUPROFEN 5-200 MG PO TABS
1.0000 | ORAL_TABLET | Freq: Three times a day (TID) | ORAL | 0 refills | Status: DC | PRN
Start: 2017-01-29 — End: 2017-02-19

## 2017-01-29 NOTE — Telephone Encounter (Signed)
Pt notified of RX and reccomendation

## 2017-01-29 NOTE — Telephone Encounter (Signed)
Per pt Ibuprofen is taking the edge off but pt is in excruciating pain Please review and advie

## 2017-01-30 DIAGNOSIS — D696 Thrombocytopenia, unspecified: Secondary | ICD-10-CM | POA: Diagnosis not present

## 2017-01-30 DIAGNOSIS — J441 Chronic obstructive pulmonary disease with (acute) exacerbation: Secondary | ICD-10-CM | POA: Diagnosis not present

## 2017-01-30 DIAGNOSIS — R0789 Other chest pain: Secondary | ICD-10-CM | POA: Diagnosis not present

## 2017-01-30 DIAGNOSIS — R0902 Hypoxemia: Secondary | ICD-10-CM | POA: Diagnosis not present

## 2017-01-30 DIAGNOSIS — Z87891 Personal history of nicotine dependence: Secondary | ICD-10-CM | POA: Diagnosis not present

## 2017-01-30 DIAGNOSIS — C8594 Non-Hodgkin lymphoma, unspecified, lymph nodes of axilla and upper limb: Secondary | ICD-10-CM | POA: Diagnosis not present

## 2017-01-30 DIAGNOSIS — E785 Hyperlipidemia, unspecified: Secondary | ICD-10-CM | POA: Diagnosis not present

## 2017-01-30 DIAGNOSIS — E7800 Pure hypercholesterolemia, unspecified: Secondary | ICD-10-CM | POA: Diagnosis not present

## 2017-01-30 DIAGNOSIS — M545 Low back pain: Secondary | ICD-10-CM | POA: Diagnosis not present

## 2017-01-30 DIAGNOSIS — I509 Heart failure, unspecified: Secondary | ICD-10-CM | POA: Diagnosis not present

## 2017-01-30 DIAGNOSIS — G8929 Other chronic pain: Secondary | ICD-10-CM | POA: Diagnosis not present

## 2017-01-30 DIAGNOSIS — R0602 Shortness of breath: Secondary | ICD-10-CM | POA: Diagnosis not present

## 2017-02-01 ENCOUNTER — Other Ambulatory Visit (HOSPITAL_COMMUNITY): Payer: Self-pay | Admitting: Oncology

## 2017-02-01 DIAGNOSIS — C8594 Non-Hodgkin lymphoma, unspecified, lymph nodes of axilla and upper limb: Secondary | ICD-10-CM | POA: Diagnosis not present

## 2017-02-01 DIAGNOSIS — R079 Chest pain, unspecified: Secondary | ICD-10-CM | POA: Diagnosis not present

## 2017-02-01 DIAGNOSIS — C8580 Other specified types of non-Hodgkin lymphoma, unspecified site: Secondary | ICD-10-CM | POA: Diagnosis not present

## 2017-02-01 DIAGNOSIS — C969 Malignant neoplasm of lymphoid, hematopoietic and related tissue, unspecified: Secondary | ICD-10-CM | POA: Diagnosis not present

## 2017-02-01 DIAGNOSIS — E7800 Pure hypercholesterolemia, unspecified: Secondary | ICD-10-CM | POA: Diagnosis not present

## 2017-02-01 DIAGNOSIS — Z87891 Personal history of nicotine dependence: Secondary | ICD-10-CM | POA: Diagnosis not present

## 2017-02-01 DIAGNOSIS — G8929 Other chronic pain: Secondary | ICD-10-CM | POA: Diagnosis not present

## 2017-02-01 DIAGNOSIS — J90 Pleural effusion, not elsewhere classified: Secondary | ICD-10-CM | POA: Diagnosis not present

## 2017-02-01 DIAGNOSIS — R0902 Hypoxemia: Secondary | ICD-10-CM | POA: Diagnosis not present

## 2017-02-01 DIAGNOSIS — R599 Enlarged lymph nodes, unspecified: Secondary | ICD-10-CM | POA: Diagnosis not present

## 2017-02-01 DIAGNOSIS — C8190 Hodgkin lymphoma, unspecified, unspecified site: Secondary | ICD-10-CM | POA: Diagnosis not present

## 2017-02-01 DIAGNOSIS — E785 Hyperlipidemia, unspecified: Secondary | ICD-10-CM | POA: Diagnosis not present

## 2017-02-01 DIAGNOSIS — R0600 Dyspnea, unspecified: Secondary | ICD-10-CM | POA: Diagnosis not present

## 2017-02-01 DIAGNOSIS — I509 Heart failure, unspecified: Secondary | ICD-10-CM | POA: Diagnosis not present

## 2017-02-01 DIAGNOSIS — J449 Chronic obstructive pulmonary disease, unspecified: Secondary | ICD-10-CM | POA: Diagnosis not present

## 2017-02-01 DIAGNOSIS — R0602 Shortness of breath: Secondary | ICD-10-CM | POA: Diagnosis not present

## 2017-02-01 DIAGNOSIS — D696 Thrombocytopenia, unspecified: Secondary | ICD-10-CM | POA: Diagnosis not present

## 2017-02-01 DIAGNOSIS — J441 Chronic obstructive pulmonary disease with (acute) exacerbation: Secondary | ICD-10-CM | POA: Diagnosis not present

## 2017-02-01 DIAGNOSIS — R591 Generalized enlarged lymph nodes: Secondary | ICD-10-CM

## 2017-02-01 DIAGNOSIS — C4911 Malignant neoplasm of connective and soft tissue of right upper limb, including shoulder: Secondary | ICD-10-CM | POA: Diagnosis not present

## 2017-02-01 DIAGNOSIS — R69 Illness, unspecified: Secondary | ICD-10-CM | POA: Diagnosis not present

## 2017-02-01 DIAGNOSIS — M545 Low back pain: Secondary | ICD-10-CM | POA: Diagnosis not present

## 2017-02-01 DIAGNOSIS — J189 Pneumonia, unspecified organism: Secondary | ICD-10-CM | POA: Diagnosis not present

## 2017-02-01 DIAGNOSIS — J9601 Acute respiratory failure with hypoxia: Secondary | ICD-10-CM | POA: Diagnosis not present

## 2017-02-02 ENCOUNTER — Ambulatory Visit (HOSPITAL_COMMUNITY): Payer: Self-pay

## 2017-02-02 DIAGNOSIS — C969 Malignant neoplasm of lymphoid, hematopoietic and related tissue, unspecified: Secondary | ICD-10-CM | POA: Diagnosis not present

## 2017-02-02 DIAGNOSIS — C8190 Hodgkin lymphoma, unspecified, unspecified site: Secondary | ICD-10-CM | POA: Diagnosis not present

## 2017-02-02 DIAGNOSIS — C4911 Malignant neoplasm of connective and soft tissue of right upper limb, including shoulder: Secondary | ICD-10-CM | POA: Diagnosis not present

## 2017-02-03 ENCOUNTER — Ambulatory Visit (HOSPITAL_COMMUNITY): Payer: Self-pay | Admitting: Oncology

## 2017-02-03 DIAGNOSIS — R69 Illness, unspecified: Secondary | ICD-10-CM | POA: Diagnosis not present

## 2017-02-03 DIAGNOSIS — R0600 Dyspnea, unspecified: Secondary | ICD-10-CM | POA: Diagnosis not present

## 2017-02-03 DIAGNOSIS — J449 Chronic obstructive pulmonary disease, unspecified: Secondary | ICD-10-CM | POA: Diagnosis not present

## 2017-02-03 DIAGNOSIS — R0902 Hypoxemia: Secondary | ICD-10-CM | POA: Diagnosis not present

## 2017-02-03 DIAGNOSIS — J9601 Acute respiratory failure with hypoxia: Secondary | ICD-10-CM | POA: Diagnosis not present

## 2017-02-03 DIAGNOSIS — R0602 Shortness of breath: Secondary | ICD-10-CM | POA: Diagnosis not present

## 2017-02-03 DIAGNOSIS — C8580 Other specified types of non-Hodgkin lymphoma, unspecified site: Secondary | ICD-10-CM | POA: Diagnosis not present

## 2017-02-08 ENCOUNTER — Ambulatory Visit (HOSPITAL_COMMUNITY): Payer: Medicare HMO

## 2017-02-08 ENCOUNTER — Ambulatory Visit (HOSPITAL_COMMUNITY): Payer: Self-pay | Admitting: Oncology

## 2017-02-09 ENCOUNTER — Ambulatory Visit: Payer: Medicare HMO | Admitting: General Surgery

## 2017-02-15 DIAGNOSIS — D696 Thrombocytopenia, unspecified: Secondary | ICD-10-CM | POA: Diagnosis not present

## 2017-02-15 DIAGNOSIS — M4800 Spinal stenosis, site unspecified: Secondary | ICD-10-CM | POA: Diagnosis not present

## 2017-02-15 DIAGNOSIS — J449 Chronic obstructive pulmonary disease, unspecified: Secondary | ICD-10-CM | POA: Diagnosis not present

## 2017-02-15 DIAGNOSIS — C8590 Non-Hodgkin lymphoma, unspecified, unspecified site: Secondary | ICD-10-CM | POA: Diagnosis not present

## 2017-02-15 DIAGNOSIS — R69 Illness, unspecified: Secondary | ICD-10-CM | POA: Diagnosis not present

## 2017-02-16 DIAGNOSIS — M25551 Pain in right hip: Secondary | ICD-10-CM | POA: Diagnosis not present

## 2017-02-16 DIAGNOSIS — J449 Chronic obstructive pulmonary disease, unspecified: Secondary | ICD-10-CM | POA: Diagnosis not present

## 2017-02-16 DIAGNOSIS — K219 Gastro-esophageal reflux disease without esophagitis: Secondary | ICD-10-CM | POA: Diagnosis not present

## 2017-02-16 DIAGNOSIS — M549 Dorsalgia, unspecified: Secondary | ICD-10-CM | POA: Diagnosis not present

## 2017-02-16 DIAGNOSIS — M545 Low back pain: Secondary | ICD-10-CM | POA: Diagnosis not present

## 2017-02-16 DIAGNOSIS — M25552 Pain in left hip: Secondary | ICD-10-CM | POA: Diagnosis not present

## 2017-02-16 DIAGNOSIS — Z7952 Long term (current) use of systemic steroids: Secondary | ICD-10-CM | POA: Diagnosis not present

## 2017-02-16 DIAGNOSIS — R079 Chest pain, unspecified: Secondary | ICD-10-CM | POA: Diagnosis not present

## 2017-02-16 DIAGNOSIS — Z79899 Other long term (current) drug therapy: Secondary | ICD-10-CM | POA: Diagnosis not present

## 2017-02-16 DIAGNOSIS — Z7951 Long term (current) use of inhaled steroids: Secondary | ICD-10-CM | POA: Diagnosis not present

## 2017-02-16 DIAGNOSIS — Z87891 Personal history of nicotine dependence: Secondary | ICD-10-CM | POA: Diagnosis not present

## 2017-02-16 DIAGNOSIS — R0602 Shortness of breath: Secondary | ICD-10-CM | POA: Diagnosis not present

## 2017-02-19 ENCOUNTER — Telehealth: Payer: Self-pay | Admitting: Family Medicine

## 2017-02-19 ENCOUNTER — Other Ambulatory Visit: Payer: Self-pay

## 2017-02-19 ENCOUNTER — Telehealth: Payer: Self-pay

## 2017-02-19 ENCOUNTER — Ambulatory Visit (INDEPENDENT_AMBULATORY_CARE_PROVIDER_SITE_OTHER): Payer: Medicare HMO | Admitting: Family Medicine

## 2017-02-19 ENCOUNTER — Encounter: Payer: Self-pay | Admitting: Family Medicine

## 2017-02-19 VITALS — BP 121/71 | HR 113 | Temp 99.4°F | Ht 73.0 in | Wt 223.0 lb

## 2017-02-19 DIAGNOSIS — R319 Hematuria, unspecified: Secondary | ICD-10-CM

## 2017-02-19 DIAGNOSIS — J449 Chronic obstructive pulmonary disease, unspecified: Secondary | ICD-10-CM | POA: Diagnosis not present

## 2017-02-19 DIAGNOSIS — R591 Generalized enlarged lymph nodes: Secondary | ICD-10-CM | POA: Diagnosis not present

## 2017-02-19 LAB — URINALYSIS, COMPLETE
Bilirubin, UA: NEGATIVE
Glucose, UA: NEGATIVE
LEUKOCYTES UA: NEGATIVE
Nitrite, UA: NEGATIVE
SPEC GRAV UA: 1.015 (ref 1.005–1.030)
Urobilinogen, Ur: 1 mg/dL (ref 0.2–1.0)
pH, UA: 5.5 (ref 5.0–7.5)

## 2017-02-19 LAB — MICROSCOPIC EXAMINATION
RBC, UA: NONE SEEN /hpf (ref 0–?)
Renal Epithel, UA: NONE SEEN /hpf
WBC, UA: NONE SEEN /hpf (ref 0–?)

## 2017-02-19 MED ORDER — FLUTICASONE FUROATE 100 MCG/ACT IN AEPB: 1.0000 | INHALATION_SPRAY | Freq: Every day | RESPIRATORY_TRACT | 1 refills | Status: AC

## 2017-02-19 MED ORDER — PREDNISONE 20 MG PO TABS: ORAL_TABLET | ORAL | 2 refills | Status: AC

## 2017-02-19 MED ORDER — FLUTICASONE-UMECLIDIN-VILANT 100-62.5-25 MCG/INH IN AEPB: 1.0000 | INHALATION_SPRAY | Freq: Every day | RESPIRATORY_TRACT | 3 refills | Status: DC

## 2017-02-19 MED ORDER — UMECLIDINIUM-VILANTEROL 62.5-25 MCG/INH IN AEPB: 1.0000 | INHALATION_SPRAY | Freq: Every day | RESPIRATORY_TRACT | 11 refills | Status: AC

## 2017-02-19 NOTE — Progress Notes (Signed)
BP 121/71   Pulse (!) 113   Temp 99.4 F (37.4 C) (Oral)   Ht '6\' 1"'  (1.854 m)   Wt 223 lb (101.2 kg)   SpO2 94%   BMI 29.42 kg/m    Subjective:    Patient ID: Don Davis, male    DOB: 1956-02-09, 61 y.o.   MRN: [id number]  HPI: Don Davis is a 61 y.o. male presenting on 02/19/2017 for Urinary Tract Infection (hematuria this morning) and Discuss meds and health (ydrocodone; lymphoma - going back and forth to [facility], has PET scan and bone marrow biopsy next week; when results come back he will be treated at [facility])   HPI Chest tightness and shortness of breath Patient has been having chest tightness and shortness of breath that he's been fighting since being diagnosed with lymphoma or leukemia that surrounding his lungs. He is on steroids but has tapered down they were helping but since his tapered down he has been struggling some with that. He says he is slightly short of breath and has some wheezing and some slight chest tightness. He was just evaluated by hematology and oncology over at [facility] and has PET scan, this coming week. He says his breathing has slightly improved with medications at the gave him but he is still having some issues with it. He denies any fevers or chills. He feels more short of breath on exertion.  Hematuria Patient has been having blood in his urine over the past couple days and wants to get examined to see if he has any type of infection. He denies any fever or chills or back pain or abdominal pain. He has been going slightly more frequently but denies any dysuria associated with.  Relevant past medical, surgical, family and social history reviewed and updated as indicated. Interim medical history since our last visit reviewed. Allergies and medications reviewed and updated.  Review of Systems  Constitutional: Negative for chills and fever.  HENT: Positive for congestion. Negative for rhinorrhea, sinus pain, sinus pressure,  sneezing and sore throat.   Eyes: Negative for discharge.  Respiratory: Positive for cough, shortness of breath and wheezing.   Cardiovascular: Negative for chest pain and leg swelling.  Genitourinary: Positive for dysuria, frequency and hematuria. Negative for decreased urine volume and urgency.  Musculoskeletal: Negative for back pain and gait problem.  Skin: Negative for rash.  All other systems reviewed and are negative.  Per HPI unless specifically indicated above     Objective:    BP 121/71   Pulse (!) 113   Temp 99.4 F (37.4 C) (Oral)   Ht '6\' 1"'  (1.854 m)   Wt 223 lb (101.2 kg)   SpO2 94%   BMI 29.42 kg/m   Wt Readings from Last 3 Encounters:  02/19/17 223 lb (101.2 kg)  01/27/17 238 lb (108 kg)  01/14/17 236 lb (107 kg)    Physical Exam  Constitutional: He is oriented to person, place, and time. He appears well-developed and well-nourished. No distress.  HENT:  Right Ear: External ear normal.  Left Ear: External ear normal.  Nose: Nose normal.  Mouth/Throat: Oropharynx is clear and moist. No oropharyngeal exudate.  Eyes: Conjunctivae are normal. No scleral icterus.  Neck: Neck supple. No thyromegaly present.  Cardiovascular: Normal rate, regular rhythm, normal heart sounds and intact distal pulses.   No murmur heard. Pulmonary/Chest: Effort normal. No accessory muscle usage. No tachypnea. No respiratory distress. He has no decreased  breath sounds. He has wheezes. He has no rhonchi. He has no rales. He exhibits no tenderness.  Abdominal: Soft. Bowel sounds are normal. He exhibits no distension. There is no hepatosplenomegaly. There is no tenderness. There is no rebound, no guarding and no CVA tenderness.  Musculoskeletal: Normal range of motion. He exhibits no edema.  Lymphadenopathy:    He has no cervical adenopathy.  Neurological: He is alert and oriented to person, place, and time. Coordination normal.  Skin: Skin is warm and dry. No rash noted. He is not  diaphoretic.  Psychiatric: He has a normal mood and affect. His behavior is normal.  Nursing note and vitals reviewed.   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  Counseling provided for all of the vaccine components Orders Placed This Encounter  Procedures  . Urinalysis, Complete    Caryl Pina, MD [facility] 02/19/2017, 10:49 AM

## 2017-02-19 NOTE — Telephone Encounter (Signed)
Change to anoro, will consider adding inhaled steroid if needed.   Laroy Apple, MD [facility] 02/19/2017, 12:50 PM

## 2017-02-19 NOTE — Telephone Encounter (Signed)
Trelegy Ellipta not covered by insurance   Preferred are Anoro Ellipta Bevespi Aerosphere inhaler

## 2017-02-19 NOTE — Telephone Encounter (Signed)
Aware of new script. 

## 2017-02-19 NOTE — Telephone Encounter (Signed)
Pharmacy changed

## 2017-02-24 DIAGNOSIS — R937 Abnormal findings on diagnostic imaging of other parts of musculoskeletal system: Secondary | ICD-10-CM | POA: Diagnosis not present

## 2017-02-24 DIAGNOSIS — R918 Other nonspecific abnormal finding of lung field: Secondary | ICD-10-CM | POA: Diagnosis not present

## 2017-02-24 DIAGNOSIS — C8590 Non-Hodgkin lymphoma, unspecified, unspecified site: Secondary | ICD-10-CM | POA: Diagnosis not present

## 2017-02-24 DIAGNOSIS — C8599 Non-Hodgkin lymphoma, unspecified, extranodal and solid organ sites: Secondary | ICD-10-CM | POA: Diagnosis not present

## 2017-02-25 DIAGNOSIS — C9200 Acute myeloblastic leukemia, not having achieved remission: Secondary | ICD-10-CM | POA: Diagnosis not present

## 2017-02-25 DIAGNOSIS — Z7951 Long term (current) use of inhaled steroids: Secondary | ICD-10-CM | POA: Diagnosis not present

## 2017-02-25 DIAGNOSIS — D696 Thrombocytopenia, unspecified: Secondary | ICD-10-CM | POA: Diagnosis not present

## 2017-02-25 DIAGNOSIS — Z79899 Other long term (current) drug therapy: Secondary | ICD-10-CM | POA: Diagnosis not present

## 2017-02-25 DIAGNOSIS — C8590 Non-Hodgkin lymphoma, unspecified, unspecified site: Secondary | ICD-10-CM | POA: Diagnosis not present

## 2017-02-25 DIAGNOSIS — J449 Chronic obstructive pulmonary disease, unspecified: Secondary | ICD-10-CM | POA: Diagnosis not present

## 2017-02-25 DIAGNOSIS — R69 Illness, unspecified: Secondary | ICD-10-CM | POA: Diagnosis not present

## 2017-02-25 DIAGNOSIS — M4800 Spinal stenosis, site unspecified: Secondary | ICD-10-CM | POA: Diagnosis not present

## 2017-03-02 DIAGNOSIS — C9200 Acute myeloblastic leukemia, not having achieved remission: Secondary | ICD-10-CM | POA: Diagnosis not present

## 2017-03-02 DIAGNOSIS — C864 Blastic NK-cell lymphoma: Secondary | ICD-10-CM | POA: Diagnosis not present

## 2017-03-04 DIAGNOSIS — R0682 Tachypnea, not elsewhere classified: Secondary | ICD-10-CM | POA: Diagnosis not present

## 2017-03-04 DIAGNOSIS — Z87891 Personal history of nicotine dependence: Secondary | ICD-10-CM | POA: Diagnosis not present

## 2017-03-04 DIAGNOSIS — K219 Gastro-esophageal reflux disease without esophagitis: Secondary | ICD-10-CM | POA: Diagnosis not present

## 2017-03-04 DIAGNOSIS — I482 Chronic atrial fibrillation: Secondary | ICD-10-CM | POA: Diagnosis not present

## 2017-03-04 DIAGNOSIS — J449 Chronic obstructive pulmonary disease, unspecified: Secondary | ICD-10-CM | POA: Diagnosis not present

## 2017-03-04 DIAGNOSIS — Z79899 Other long term (current) drug therapy: Secondary | ICD-10-CM | POA: Diagnosis not present

## 2017-03-04 DIAGNOSIS — R0602 Shortness of breath: Secondary | ICD-10-CM | POA: Diagnosis not present

## 2017-03-04 DIAGNOSIS — Z7952 Long term (current) use of systemic steroids: Secondary | ICD-10-CM | POA: Diagnosis not present

## 2017-03-04 DIAGNOSIS — C9200 Acute myeloblastic leukemia, not having achieved remission: Secondary | ICD-10-CM | POA: Diagnosis not present

## 2017-03-04 DIAGNOSIS — J90 Pleural effusion, not elsewhere classified: Secondary | ICD-10-CM | POA: Diagnosis not present

## 2017-03-04 DIAGNOSIS — M1990 Unspecified osteoarthritis, unspecified site: Secondary | ICD-10-CM | POA: Diagnosis not present

## 2017-03-05 DIAGNOSIS — R7989 Other specified abnormal findings of blood chemistry: Secondary | ICD-10-CM | POA: Diagnosis not present

## 2017-03-05 DIAGNOSIS — I491 Atrial premature depolarization: Secondary | ICD-10-CM | POA: Diagnosis not present

## 2017-03-05 DIAGNOSIS — R600 Localized edema: Secondary | ICD-10-CM | POA: Diagnosis not present

## 2017-03-05 DIAGNOSIS — R279 Unspecified lack of coordination: Secondary | ICD-10-CM | POA: Diagnosis not present

## 2017-03-05 DIAGNOSIS — B353 Tinea pedis: Secondary | ICD-10-CM | POA: Diagnosis not present

## 2017-03-05 DIAGNOSIS — R52 Pain, unspecified: Secondary | ICD-10-CM | POA: Diagnosis not present

## 2017-03-05 DIAGNOSIS — J449 Chronic obstructive pulmonary disease, unspecified: Secondary | ICD-10-CM | POA: Diagnosis not present

## 2017-03-05 DIAGNOSIS — R590 Localized enlarged lymph nodes: Secondary | ICD-10-CM | POA: Diagnosis not present

## 2017-03-05 DIAGNOSIS — R17 Unspecified jaundice: Secondary | ICD-10-CM | POA: Diagnosis not present

## 2017-03-05 DIAGNOSIS — R402251 Coma scale, best verbal response, oriented, in the field [EMT or ambulance]: Secondary | ICD-10-CM | POA: Diagnosis not present

## 2017-03-05 DIAGNOSIS — K8020 Calculus of gallbladder without cholecystitis without obstruction: Secondary | ICD-10-CM | POA: Diagnosis not present

## 2017-03-05 DIAGNOSIS — Z79899 Other long term (current) drug therapy: Secondary | ICD-10-CM | POA: Diagnosis not present

## 2017-03-05 DIAGNOSIS — Z9981 Dependence on supplemental oxygen: Secondary | ICD-10-CM | POA: Diagnosis not present

## 2017-03-05 DIAGNOSIS — R945 Abnormal results of liver function studies: Secondary | ICD-10-CM | POA: Diagnosis not present

## 2017-03-05 DIAGNOSIS — D61818 Other pancytopenia: Secondary | ICD-10-CM | POA: Diagnosis not present

## 2017-03-05 DIAGNOSIS — Z9221 Personal history of antineoplastic chemotherapy: Secondary | ICD-10-CM | POA: Diagnosis not present

## 2017-03-05 DIAGNOSIS — J90 Pleural effusion, not elsewhere classified: Secondary | ICD-10-CM | POA: Diagnosis not present

## 2017-03-05 DIAGNOSIS — R911 Solitary pulmonary nodule: Secondary | ICD-10-CM | POA: Diagnosis not present

## 2017-03-05 DIAGNOSIS — R0602 Shortness of breath: Secondary | ICD-10-CM | POA: Diagnosis not present

## 2017-03-05 DIAGNOSIS — M549 Dorsalgia, unspecified: Secondary | ICD-10-CM | POA: Diagnosis not present

## 2017-03-05 DIAGNOSIS — J439 Emphysema, unspecified: Secondary | ICD-10-CM | POA: Diagnosis not present

## 2017-03-05 DIAGNOSIS — Z5111 Encounter for antineoplastic chemotherapy: Secondary | ICD-10-CM | POA: Diagnosis not present

## 2017-03-05 DIAGNOSIS — R188 Other ascites: Secondary | ICD-10-CM | POA: Diagnosis not present

## 2017-03-05 DIAGNOSIS — R0902 Hypoxemia: Secondary | ICD-10-CM | POA: Diagnosis not present

## 2017-03-05 DIAGNOSIS — Z48813 Encounter for surgical aftercare following surgery on the respiratory system: Secondary | ICD-10-CM | POA: Diagnosis not present

## 2017-03-05 DIAGNOSIS — R002 Palpitations: Secondary | ICD-10-CM | POA: Diagnosis not present

## 2017-03-05 DIAGNOSIS — D61810 Antineoplastic chemotherapy induced pancytopenia: Secondary | ICD-10-CM | POA: Diagnosis not present

## 2017-03-05 DIAGNOSIS — R000 Tachycardia, unspecified: Secondary | ICD-10-CM | POA: Diagnosis not present

## 2017-03-05 DIAGNOSIS — J9691 Respiratory failure, unspecified with hypoxia: Secondary | ICD-10-CM | POA: Diagnosis not present

## 2017-03-05 DIAGNOSIS — R21 Rash and other nonspecific skin eruption: Secondary | ICD-10-CM | POA: Diagnosis not present

## 2017-03-05 DIAGNOSIS — J811 Chronic pulmonary edema: Secondary | ICD-10-CM | POA: Diagnosis not present

## 2017-03-05 DIAGNOSIS — C9200 Acute myeloblastic leukemia, not having achieved remission: Secondary | ICD-10-CM | POA: Diagnosis not present

## 2017-03-05 DIAGNOSIS — J189 Pneumonia, unspecified organism: Secondary | ICD-10-CM | POA: Diagnosis not present

## 2017-03-05 DIAGNOSIS — M6281 Muscle weakness (generalized): Secondary | ICD-10-CM | POA: Diagnosis not present

## 2017-03-05 DIAGNOSIS — J9621 Acute and chronic respiratory failure with hypoxia: Secondary | ICD-10-CM | POA: Diagnosis not present

## 2017-03-05 DIAGNOSIS — J9601 Acute respiratory failure with hypoxia: Secondary | ICD-10-CM | POA: Diagnosis not present

## 2017-03-05 DIAGNOSIS — R402141 Coma scale, eyes open, spontaneous, in the field [EMT or ambulance]: Secondary | ICD-10-CM | POA: Diagnosis not present

## 2017-03-05 DIAGNOSIS — N179 Acute kidney failure, unspecified: Secondary | ICD-10-CM | POA: Diagnosis not present

## 2017-03-05 DIAGNOSIS — C9590 Leukemia, unspecified not having achieved remission: Secondary | ICD-10-CM | POA: Diagnosis not present

## 2017-03-05 DIAGNOSIS — I5033 Acute on chronic diastolic (congestive) heart failure: Secondary | ICD-10-CM | POA: Diagnosis not present

## 2017-03-05 DIAGNOSIS — R9431 Abnormal electrocardiogram [ECG] [EKG]: Secondary | ICD-10-CM | POA: Diagnosis not present

## 2017-03-05 DIAGNOSIS — R0600 Dyspnea, unspecified: Secondary | ICD-10-CM | POA: Diagnosis not present

## 2017-03-05 DIAGNOSIS — Z452 Encounter for adjustment and management of vascular access device: Secondary | ICD-10-CM | POA: Diagnosis not present

## 2017-03-05 DIAGNOSIS — R339 Retention of urine, unspecified: Secondary | ICD-10-CM | POA: Diagnosis not present

## 2017-03-05 DIAGNOSIS — M7989 Other specified soft tissue disorders: Secondary | ICD-10-CM | POA: Diagnosis not present

## 2017-03-05 DIAGNOSIS — J984 Other disorders of lung: Secondary | ICD-10-CM | POA: Diagnosis not present

## 2017-03-05 DIAGNOSIS — J910 Malignant pleural effusion: Secondary | ICD-10-CM | POA: Diagnosis not present

## 2017-03-05 DIAGNOSIS — B359 Dermatophytosis, unspecified: Secondary | ICD-10-CM | POA: Diagnosis not present

## 2017-03-05 DIAGNOSIS — Z66 Do not resuscitate: Secondary | ICD-10-CM | POA: Diagnosis not present

## 2017-03-05 DIAGNOSIS — R162 Hepatomegaly with splenomegaly, not elsewhere classified: Secondary | ICD-10-CM | POA: Diagnosis not present

## 2017-03-05 DIAGNOSIS — G894 Chronic pain syndrome: Secondary | ICD-10-CM | POA: Diagnosis not present

## 2017-03-05 DIAGNOSIS — R69 Illness, unspecified: Secondary | ICD-10-CM | POA: Diagnosis not present

## 2017-03-05 DIAGNOSIS — R5381 Other malaise: Secondary | ICD-10-CM | POA: Diagnosis not present

## 2017-03-05 DIAGNOSIS — J440 Chronic obstructive pulmonary disease with acute lower respiratory infection: Secondary | ICD-10-CM | POA: Diagnosis not present

## 2017-03-05 DIAGNOSIS — E871 Hypo-osmolality and hyponatremia: Secondary | ICD-10-CM | POA: Diagnosis not present

## 2017-03-05 DIAGNOSIS — I4581 Long QT syndrome: Secondary | ICD-10-CM | POA: Diagnosis not present

## 2017-03-05 DIAGNOSIS — Z01810 Encounter for preprocedural cardiovascular examination: Secondary | ICD-10-CM | POA: Diagnosis not present

## 2017-03-05 DIAGNOSIS — K219 Gastro-esophageal reflux disease without esophagitis: Secondary | ICD-10-CM | POA: Diagnosis not present

## 2017-03-05 DIAGNOSIS — R918 Other nonspecific abnormal finding of lung field: Secondary | ICD-10-CM | POA: Diagnosis not present

## 2017-03-05 DIAGNOSIS — R402361 Coma scale, best motor response, obeys commands, in the field [EMT or ambulance]: Secondary | ICD-10-CM | POA: Diagnosis not present

## 2017-03-05 DIAGNOSIS — R599 Enlarged lymph nodes, unspecified: Secondary | ICD-10-CM | POA: Diagnosis not present

## 2017-03-06 DIAGNOSIS — Z48813 Encounter for surgical aftercare following surgery on the respiratory system: Secondary | ICD-10-CM | POA: Diagnosis not present

## 2017-03-06 DIAGNOSIS — Z01810 Encounter for preprocedural cardiovascular examination: Secondary | ICD-10-CM | POA: Diagnosis not present

## 2017-03-06 DIAGNOSIS — J90 Pleural effusion, not elsewhere classified: Secondary | ICD-10-CM | POA: Diagnosis not present

## 2017-03-06 DIAGNOSIS — R918 Other nonspecific abnormal finding of lung field: Secondary | ICD-10-CM | POA: Diagnosis not present

## 2017-03-06 DIAGNOSIS — J9621 Acute and chronic respiratory failure with hypoxia: Secondary | ICD-10-CM | POA: Diagnosis not present

## 2017-03-06 DIAGNOSIS — R002 Palpitations: Secondary | ICD-10-CM | POA: Diagnosis not present

## 2017-03-06 DIAGNOSIS — J9601 Acute respiratory failure with hypoxia: Secondary | ICD-10-CM | POA: Diagnosis not present

## 2017-03-06 DIAGNOSIS — C9200 Acute myeloblastic leukemia, not having achieved remission: Secondary | ICD-10-CM | POA: Diagnosis not present

## 2017-03-06 DIAGNOSIS — R0602 Shortness of breath: Secondary | ICD-10-CM | POA: Diagnosis not present

## 2017-03-06 DIAGNOSIS — J449 Chronic obstructive pulmonary disease, unspecified: Secondary | ICD-10-CM | POA: Diagnosis not present

## 2017-03-07 DIAGNOSIS — J9601 Acute respiratory failure with hypoxia: Secondary | ICD-10-CM | POA: Diagnosis not present

## 2017-03-07 DIAGNOSIS — J811 Chronic pulmonary edema: Secondary | ICD-10-CM | POA: Diagnosis not present

## 2017-03-07 DIAGNOSIS — C9200 Acute myeloblastic leukemia, not having achieved remission: Secondary | ICD-10-CM | POA: Diagnosis not present

## 2017-03-07 DIAGNOSIS — R002 Palpitations: Secondary | ICD-10-CM | POA: Diagnosis not present

## 2017-03-07 DIAGNOSIS — J449 Chronic obstructive pulmonary disease, unspecified: Secondary | ICD-10-CM | POA: Diagnosis not present

## 2017-03-07 DIAGNOSIS — Z5111 Encounter for antineoplastic chemotherapy: Secondary | ICD-10-CM | POA: Diagnosis not present

## 2017-03-07 DIAGNOSIS — J9621 Acute and chronic respiratory failure with hypoxia: Secondary | ICD-10-CM | POA: Diagnosis not present

## 2017-03-07 DIAGNOSIS — J90 Pleural effusion, not elsewhere classified: Secondary | ICD-10-CM | POA: Diagnosis not present

## 2017-03-08 DIAGNOSIS — R002 Palpitations: Secondary | ICD-10-CM | POA: Diagnosis not present

## 2017-03-08 DIAGNOSIS — J9621 Acute and chronic respiratory failure with hypoxia: Secondary | ICD-10-CM | POA: Diagnosis not present

## 2017-03-08 DIAGNOSIS — J449 Chronic obstructive pulmonary disease, unspecified: Secondary | ICD-10-CM | POA: Diagnosis not present

## 2017-03-08 DIAGNOSIS — J90 Pleural effusion, not elsewhere classified: Secondary | ICD-10-CM | POA: Diagnosis not present

## 2017-03-08 DIAGNOSIS — C9200 Acute myeloblastic leukemia, not having achieved remission: Secondary | ICD-10-CM | POA: Diagnosis not present

## 2017-03-08 DIAGNOSIS — J9601 Acute respiratory failure with hypoxia: Secondary | ICD-10-CM | POA: Diagnosis not present

## 2017-03-08 DIAGNOSIS — Z5111 Encounter for antineoplastic chemotherapy: Secondary | ICD-10-CM | POA: Diagnosis not present

## 2017-03-09 DIAGNOSIS — G894 Chronic pain syndrome: Secondary | ICD-10-CM | POA: Diagnosis not present

## 2017-03-09 DIAGNOSIS — R002 Palpitations: Secondary | ICD-10-CM | POA: Diagnosis not present

## 2017-03-09 DIAGNOSIS — J449 Chronic obstructive pulmonary disease, unspecified: Secondary | ICD-10-CM | POA: Diagnosis not present

## 2017-03-09 DIAGNOSIS — Z9221 Personal history of antineoplastic chemotherapy: Secondary | ICD-10-CM | POA: Diagnosis not present

## 2017-03-09 DIAGNOSIS — J90 Pleural effusion, not elsewhere classified: Secondary | ICD-10-CM | POA: Diagnosis not present

## 2017-03-09 DIAGNOSIS — J9691 Respiratory failure, unspecified with hypoxia: Secondary | ICD-10-CM | POA: Diagnosis not present

## 2017-03-09 DIAGNOSIS — J9621 Acute and chronic respiratory failure with hypoxia: Secondary | ICD-10-CM | POA: Diagnosis not present

## 2017-03-09 DIAGNOSIS — C9200 Acute myeloblastic leukemia, not having achieved remission: Secondary | ICD-10-CM | POA: Diagnosis not present

## 2017-03-10 DIAGNOSIS — G894 Chronic pain syndrome: Secondary | ICD-10-CM | POA: Diagnosis not present

## 2017-03-10 DIAGNOSIS — K219 Gastro-esophageal reflux disease without esophagitis: Secondary | ICD-10-CM | POA: Diagnosis not present

## 2017-03-10 DIAGNOSIS — J449 Chronic obstructive pulmonary disease, unspecified: Secondary | ICD-10-CM | POA: Diagnosis not present

## 2017-03-10 DIAGNOSIS — R002 Palpitations: Secondary | ICD-10-CM | POA: Diagnosis not present

## 2017-03-10 DIAGNOSIS — C9200 Acute myeloblastic leukemia, not having achieved remission: Secondary | ICD-10-CM | POA: Diagnosis not present

## 2017-03-10 DIAGNOSIS — J9691 Respiratory failure, unspecified with hypoxia: Secondary | ICD-10-CM | POA: Diagnosis not present

## 2017-03-10 DIAGNOSIS — J9601 Acute respiratory failure with hypoxia: Secondary | ICD-10-CM | POA: Diagnosis not present

## 2017-03-11 DIAGNOSIS — C9200 Acute myeloblastic leukemia, not having achieved remission: Secondary | ICD-10-CM | POA: Diagnosis not present

## 2017-03-11 DIAGNOSIS — R000 Tachycardia, unspecified: Secondary | ICD-10-CM | POA: Diagnosis not present

## 2017-03-11 DIAGNOSIS — M549 Dorsalgia, unspecified: Secondary | ICD-10-CM | POA: Diagnosis not present

## 2017-03-11 DIAGNOSIS — J9601 Acute respiratory failure with hypoxia: Secondary | ICD-10-CM | POA: Diagnosis not present

## 2017-03-11 DIAGNOSIS — J90 Pleural effusion, not elsewhere classified: Secondary | ICD-10-CM | POA: Diagnosis not present

## 2017-03-11 DIAGNOSIS — J449 Chronic obstructive pulmonary disease, unspecified: Secondary | ICD-10-CM | POA: Diagnosis not present

## 2017-03-11 DIAGNOSIS — R0902 Hypoxemia: Secondary | ICD-10-CM | POA: Diagnosis not present

## 2017-03-11 DIAGNOSIS — R918 Other nonspecific abnormal finding of lung field: Secondary | ICD-10-CM | POA: Diagnosis not present

## 2017-03-11 DIAGNOSIS — K219 Gastro-esophageal reflux disease without esophagitis: Secondary | ICD-10-CM | POA: Diagnosis not present

## 2017-03-12 DIAGNOSIS — M549 Dorsalgia, unspecified: Secondary | ICD-10-CM | POA: Diagnosis not present

## 2017-03-12 DIAGNOSIS — C9200 Acute myeloblastic leukemia, not having achieved remission: Secondary | ICD-10-CM | POA: Diagnosis not present

## 2017-03-12 DIAGNOSIS — J9601 Acute respiratory failure with hypoxia: Secondary | ICD-10-CM | POA: Diagnosis not present

## 2017-03-12 DIAGNOSIS — K219 Gastro-esophageal reflux disease without esophagitis: Secondary | ICD-10-CM | POA: Diagnosis not present

## 2017-03-12 DIAGNOSIS — J449 Chronic obstructive pulmonary disease, unspecified: Secondary | ICD-10-CM | POA: Diagnosis not present

## 2017-03-13 DIAGNOSIS — J9601 Acute respiratory failure with hypoxia: Secondary | ICD-10-CM | POA: Diagnosis not present

## 2017-03-13 DIAGNOSIS — C9200 Acute myeloblastic leukemia, not having achieved remission: Secondary | ICD-10-CM | POA: Diagnosis not present

## 2017-03-13 DIAGNOSIS — J449 Chronic obstructive pulmonary disease, unspecified: Secondary | ICD-10-CM | POA: Diagnosis not present

## 2017-03-13 DIAGNOSIS — K219 Gastro-esophageal reflux disease without esophagitis: Secondary | ICD-10-CM | POA: Diagnosis not present

## 2017-03-13 DIAGNOSIS — M549 Dorsalgia, unspecified: Secondary | ICD-10-CM | POA: Diagnosis not present

## 2017-03-14 DIAGNOSIS — C9200 Acute myeloblastic leukemia, not having achieved remission: Secondary | ICD-10-CM | POA: Diagnosis not present

## 2017-03-14 DIAGNOSIS — J9601 Acute respiratory failure with hypoxia: Secondary | ICD-10-CM | POA: Diagnosis not present

## 2017-03-14 DIAGNOSIS — J449 Chronic obstructive pulmonary disease, unspecified: Secondary | ICD-10-CM | POA: Diagnosis not present

## 2017-03-14 DIAGNOSIS — R918 Other nonspecific abnormal finding of lung field: Secondary | ICD-10-CM | POA: Diagnosis not present

## 2017-03-14 DIAGNOSIS — M549 Dorsalgia, unspecified: Secondary | ICD-10-CM | POA: Diagnosis not present

## 2017-03-14 DIAGNOSIS — K219 Gastro-esophageal reflux disease without esophagitis: Secondary | ICD-10-CM | POA: Diagnosis not present

## 2017-03-15 DIAGNOSIS — M549 Dorsalgia, unspecified: Secondary | ICD-10-CM | POA: Diagnosis not present

## 2017-03-15 DIAGNOSIS — J90 Pleural effusion, not elsewhere classified: Secondary | ICD-10-CM | POA: Diagnosis not present

## 2017-03-15 DIAGNOSIS — J449 Chronic obstructive pulmonary disease, unspecified: Secondary | ICD-10-CM | POA: Diagnosis not present

## 2017-03-15 DIAGNOSIS — K219 Gastro-esophageal reflux disease without esophagitis: Secondary | ICD-10-CM | POA: Diagnosis not present

## 2017-03-15 DIAGNOSIS — J984 Other disorders of lung: Secondary | ICD-10-CM | POA: Diagnosis not present

## 2017-03-15 DIAGNOSIS — J9601 Acute respiratory failure with hypoxia: Secondary | ICD-10-CM | POA: Diagnosis not present

## 2017-03-15 DIAGNOSIS — R590 Localized enlarged lymph nodes: Secondary | ICD-10-CM | POA: Diagnosis not present

## 2017-03-15 DIAGNOSIS — C9200 Acute myeloblastic leukemia, not having achieved remission: Secondary | ICD-10-CM | POA: Diagnosis not present

## 2017-03-16 DIAGNOSIS — J9601 Acute respiratory failure with hypoxia: Secondary | ICD-10-CM | POA: Diagnosis not present

## 2017-03-16 DIAGNOSIS — M7989 Other specified soft tissue disorders: Secondary | ICD-10-CM | POA: Diagnosis not present

## 2017-03-16 DIAGNOSIS — M549 Dorsalgia, unspecified: Secondary | ICD-10-CM | POA: Diagnosis not present

## 2017-03-16 DIAGNOSIS — C9200 Acute myeloblastic leukemia, not having achieved remission: Secondary | ICD-10-CM | POA: Diagnosis not present

## 2017-03-16 DIAGNOSIS — K219 Gastro-esophageal reflux disease without esophagitis: Secondary | ICD-10-CM | POA: Diagnosis not present

## 2017-03-16 DIAGNOSIS — J449 Chronic obstructive pulmonary disease, unspecified: Secondary | ICD-10-CM | POA: Diagnosis not present

## 2017-03-17 DIAGNOSIS — M549 Dorsalgia, unspecified: Secondary | ICD-10-CM | POA: Diagnosis not present

## 2017-03-17 DIAGNOSIS — R600 Localized edema: Secondary | ICD-10-CM | POA: Diagnosis not present

## 2017-03-17 DIAGNOSIS — K219 Gastro-esophageal reflux disease without esophagitis: Secondary | ICD-10-CM | POA: Diagnosis not present

## 2017-03-17 DIAGNOSIS — J449 Chronic obstructive pulmonary disease, unspecified: Secondary | ICD-10-CM | POA: Diagnosis not present

## 2017-03-17 DIAGNOSIS — J9601 Acute respiratory failure with hypoxia: Secondary | ICD-10-CM | POA: Diagnosis not present

## 2017-03-17 DIAGNOSIS — C9200 Acute myeloblastic leukemia, not having achieved remission: Secondary | ICD-10-CM | POA: Diagnosis not present

## 2017-03-18 DIAGNOSIS — C9200 Acute myeloblastic leukemia, not having achieved remission: Secondary | ICD-10-CM | POA: Diagnosis not present

## 2017-03-18 DIAGNOSIS — J449 Chronic obstructive pulmonary disease, unspecified: Secondary | ICD-10-CM | POA: Diagnosis not present

## 2017-03-18 DIAGNOSIS — K219 Gastro-esophageal reflux disease without esophagitis: Secondary | ICD-10-CM | POA: Diagnosis not present

## 2017-03-18 DIAGNOSIS — J9601 Acute respiratory failure with hypoxia: Secondary | ICD-10-CM | POA: Diagnosis not present

## 2017-03-18 DIAGNOSIS — R600 Localized edema: Secondary | ICD-10-CM | POA: Diagnosis not present

## 2017-03-19 DIAGNOSIS — J9601 Acute respiratory failure with hypoxia: Secondary | ICD-10-CM | POA: Diagnosis not present

## 2017-03-19 DIAGNOSIS — J449 Chronic obstructive pulmonary disease, unspecified: Secondary | ICD-10-CM | POA: Diagnosis not present

## 2017-03-19 DIAGNOSIS — R21 Rash and other nonspecific skin eruption: Secondary | ICD-10-CM | POA: Diagnosis not present

## 2017-03-19 DIAGNOSIS — B353 Tinea pedis: Secondary | ICD-10-CM | POA: Diagnosis not present

## 2017-03-19 DIAGNOSIS — C9200 Acute myeloblastic leukemia, not having achieved remission: Secondary | ICD-10-CM | POA: Diagnosis not present

## 2017-03-19 DIAGNOSIS — K219 Gastro-esophageal reflux disease without esophagitis: Secondary | ICD-10-CM | POA: Diagnosis not present

## 2017-03-20 DIAGNOSIS — C9200 Acute myeloblastic leukemia, not having achieved remission: Secondary | ICD-10-CM | POA: Diagnosis not present

## 2017-03-20 DIAGNOSIS — J9691 Respiratory failure, unspecified with hypoxia: Secondary | ICD-10-CM | POA: Diagnosis not present

## 2017-03-20 DIAGNOSIS — J449 Chronic obstructive pulmonary disease, unspecified: Secondary | ICD-10-CM | POA: Diagnosis not present

## 2017-03-20 DIAGNOSIS — R7989 Other specified abnormal findings of blood chemistry: Secondary | ICD-10-CM | POA: Diagnosis not present

## 2017-03-20 DIAGNOSIS — E871 Hypo-osmolality and hyponatremia: Secondary | ICD-10-CM | POA: Diagnosis not present

## 2017-03-21 DIAGNOSIS — J9691 Respiratory failure, unspecified with hypoxia: Secondary | ICD-10-CM | POA: Diagnosis not present

## 2017-03-21 DIAGNOSIS — E871 Hypo-osmolality and hyponatremia: Secondary | ICD-10-CM | POA: Diagnosis not present

## 2017-03-21 DIAGNOSIS — Z452 Encounter for adjustment and management of vascular access device: Secondary | ICD-10-CM | POA: Diagnosis not present

## 2017-03-21 DIAGNOSIS — J90 Pleural effusion, not elsewhere classified: Secondary | ICD-10-CM | POA: Diagnosis not present

## 2017-03-21 DIAGNOSIS — R17 Unspecified jaundice: Secondary | ICD-10-CM | POA: Diagnosis not present

## 2017-03-21 DIAGNOSIS — R918 Other nonspecific abnormal finding of lung field: Secondary | ICD-10-CM | POA: Diagnosis not present

## 2017-03-21 DIAGNOSIS — C9200 Acute myeloblastic leukemia, not having achieved remission: Secondary | ICD-10-CM | POA: Diagnosis not present

## 2017-03-21 DIAGNOSIS — B359 Dermatophytosis, unspecified: Secondary | ICD-10-CM | POA: Diagnosis not present

## 2017-03-22 DIAGNOSIS — C9200 Acute myeloblastic leukemia, not having achieved remission: Secondary | ICD-10-CM | POA: Diagnosis not present

## 2017-03-22 DIAGNOSIS — K219 Gastro-esophageal reflux disease without esophagitis: Secondary | ICD-10-CM | POA: Diagnosis not present

## 2017-03-22 DIAGNOSIS — K8020 Calculus of gallbladder without cholecystitis without obstruction: Secondary | ICD-10-CM | POA: Diagnosis not present

## 2017-03-22 DIAGNOSIS — J449 Chronic obstructive pulmonary disease, unspecified: Secondary | ICD-10-CM | POA: Diagnosis not present

## 2017-03-22 DIAGNOSIS — E871 Hypo-osmolality and hyponatremia: Secondary | ICD-10-CM | POA: Diagnosis not present

## 2017-03-22 DIAGNOSIS — J9601 Acute respiratory failure with hypoxia: Secondary | ICD-10-CM | POA: Diagnosis not present

## 2017-03-22 DIAGNOSIS — R339 Retention of urine, unspecified: Secondary | ICD-10-CM | POA: Diagnosis not present

## 2017-03-22 DIAGNOSIS — R945 Abnormal results of liver function studies: Secondary | ICD-10-CM | POA: Diagnosis not present

## 2017-03-22 DIAGNOSIS — R162 Hepatomegaly with splenomegaly, not elsewhere classified: Secondary | ICD-10-CM | POA: Diagnosis not present

## 2017-03-22 DIAGNOSIS — R599 Enlarged lymph nodes, unspecified: Secondary | ICD-10-CM | POA: Diagnosis not present

## 2017-03-22 DIAGNOSIS — R188 Other ascites: Secondary | ICD-10-CM | POA: Diagnosis not present

## 2017-03-23 DIAGNOSIS — D61818 Other pancytopenia: Secondary | ICD-10-CM | POA: Diagnosis not present

## 2017-03-23 DIAGNOSIS — C9200 Acute myeloblastic leukemia, not having achieved remission: Secondary | ICD-10-CM | POA: Diagnosis not present

## 2017-03-23 DIAGNOSIS — J910 Malignant pleural effusion: Secondary | ICD-10-CM | POA: Diagnosis not present

## 2017-03-23 DIAGNOSIS — J449 Chronic obstructive pulmonary disease, unspecified: Secondary | ICD-10-CM | POA: Diagnosis not present

## 2017-03-23 DIAGNOSIS — J9691 Respiratory failure, unspecified with hypoxia: Secondary | ICD-10-CM | POA: Diagnosis not present

## 2017-03-23 DIAGNOSIS — I491 Atrial premature depolarization: Secondary | ICD-10-CM | POA: Diagnosis not present

## 2017-03-23 DIAGNOSIS — R0600 Dyspnea, unspecified: Secondary | ICD-10-CM | POA: Diagnosis not present

## 2017-03-23 DIAGNOSIS — J9601 Acute respiratory failure with hypoxia: Secondary | ICD-10-CM | POA: Diagnosis not present

## 2017-03-23 DIAGNOSIS — I4581 Long QT syndrome: Secondary | ICD-10-CM | POA: Diagnosis not present

## 2017-03-23 DIAGNOSIS — R000 Tachycardia, unspecified: Secondary | ICD-10-CM | POA: Diagnosis not present

## 2017-03-23 DIAGNOSIS — N179 Acute kidney failure, unspecified: Secondary | ICD-10-CM | POA: Diagnosis not present

## 2017-03-23 DIAGNOSIS — R918 Other nonspecific abnormal finding of lung field: Secondary | ICD-10-CM | POA: Diagnosis not present

## 2017-03-23 DIAGNOSIS — J90 Pleural effusion, not elsewhere classified: Secondary | ICD-10-CM | POA: Diagnosis not present

## 2017-03-24 DIAGNOSIS — J9601 Acute respiratory failure with hypoxia: Secondary | ICD-10-CM | POA: Diagnosis not present

## 2017-03-24 DIAGNOSIS — J9621 Acute and chronic respiratory failure with hypoxia: Secondary | ICD-10-CM | POA: Diagnosis not present

## 2017-03-24 DIAGNOSIS — J449 Chronic obstructive pulmonary disease, unspecified: Secondary | ICD-10-CM | POA: Diagnosis not present

## 2017-03-24 DIAGNOSIS — D61818 Other pancytopenia: Secondary | ICD-10-CM | POA: Diagnosis not present

## 2017-03-24 DIAGNOSIS — R69 Illness, unspecified: Secondary | ICD-10-CM | POA: Diagnosis not present

## 2017-03-24 DIAGNOSIS — C9200 Acute myeloblastic leukemia, not having achieved remission: Secondary | ICD-10-CM | POA: Diagnosis not present

## 2017-03-24 DIAGNOSIS — K219 Gastro-esophageal reflux disease without esophagitis: Secondary | ICD-10-CM | POA: Diagnosis not present

## 2017-03-24 DIAGNOSIS — N179 Acute kidney failure, unspecified: Secondary | ICD-10-CM | POA: Diagnosis not present

## 2017-04-06 DIAGNOSIS — R0602 Shortness of breath: Secondary | ICD-10-CM | POA: Diagnosis not present

## 2017-04-06 DIAGNOSIS — J9601 Acute respiratory failure with hypoxia: Secondary | ICD-10-CM | POA: Diagnosis not present

## 2017-04-06 DIAGNOSIS — J449 Chronic obstructive pulmonary disease, unspecified: Secondary | ICD-10-CM | POA: Diagnosis not present

## 2017-04-19 DEATH — deceased

## 2018-11-26 IMAGING — DX DG CERVICAL SPINE 2 OR 3 VIEWS
3 series · 3 of 3 positions shown · non-contrast
Comparison: 06/24/2016 .

CLINICAL DATA: Mass on back of neck.  Fever .

EXAM:
CERVICAL SPINE - 2-3 VIEW

[c-spine lat]
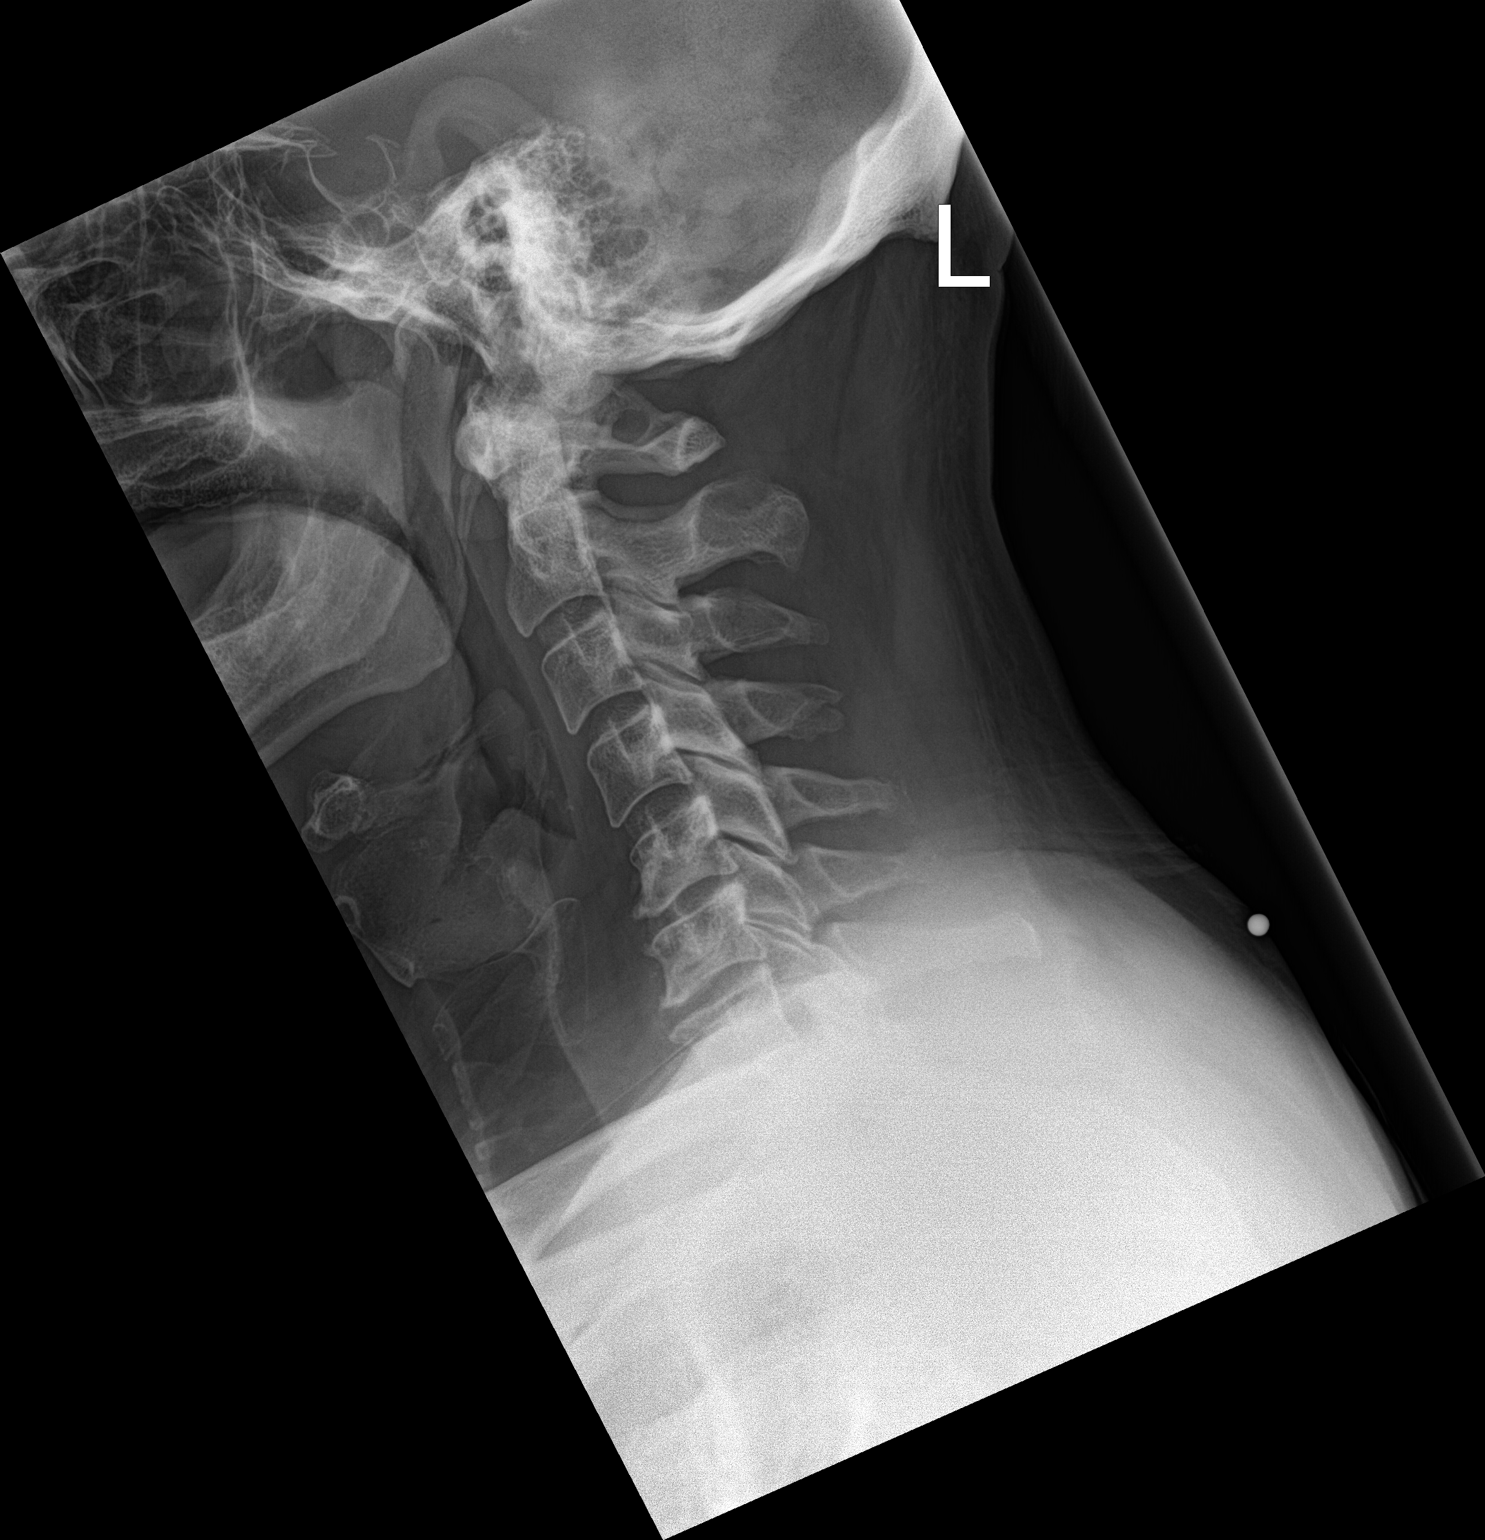

[c-spine ap]
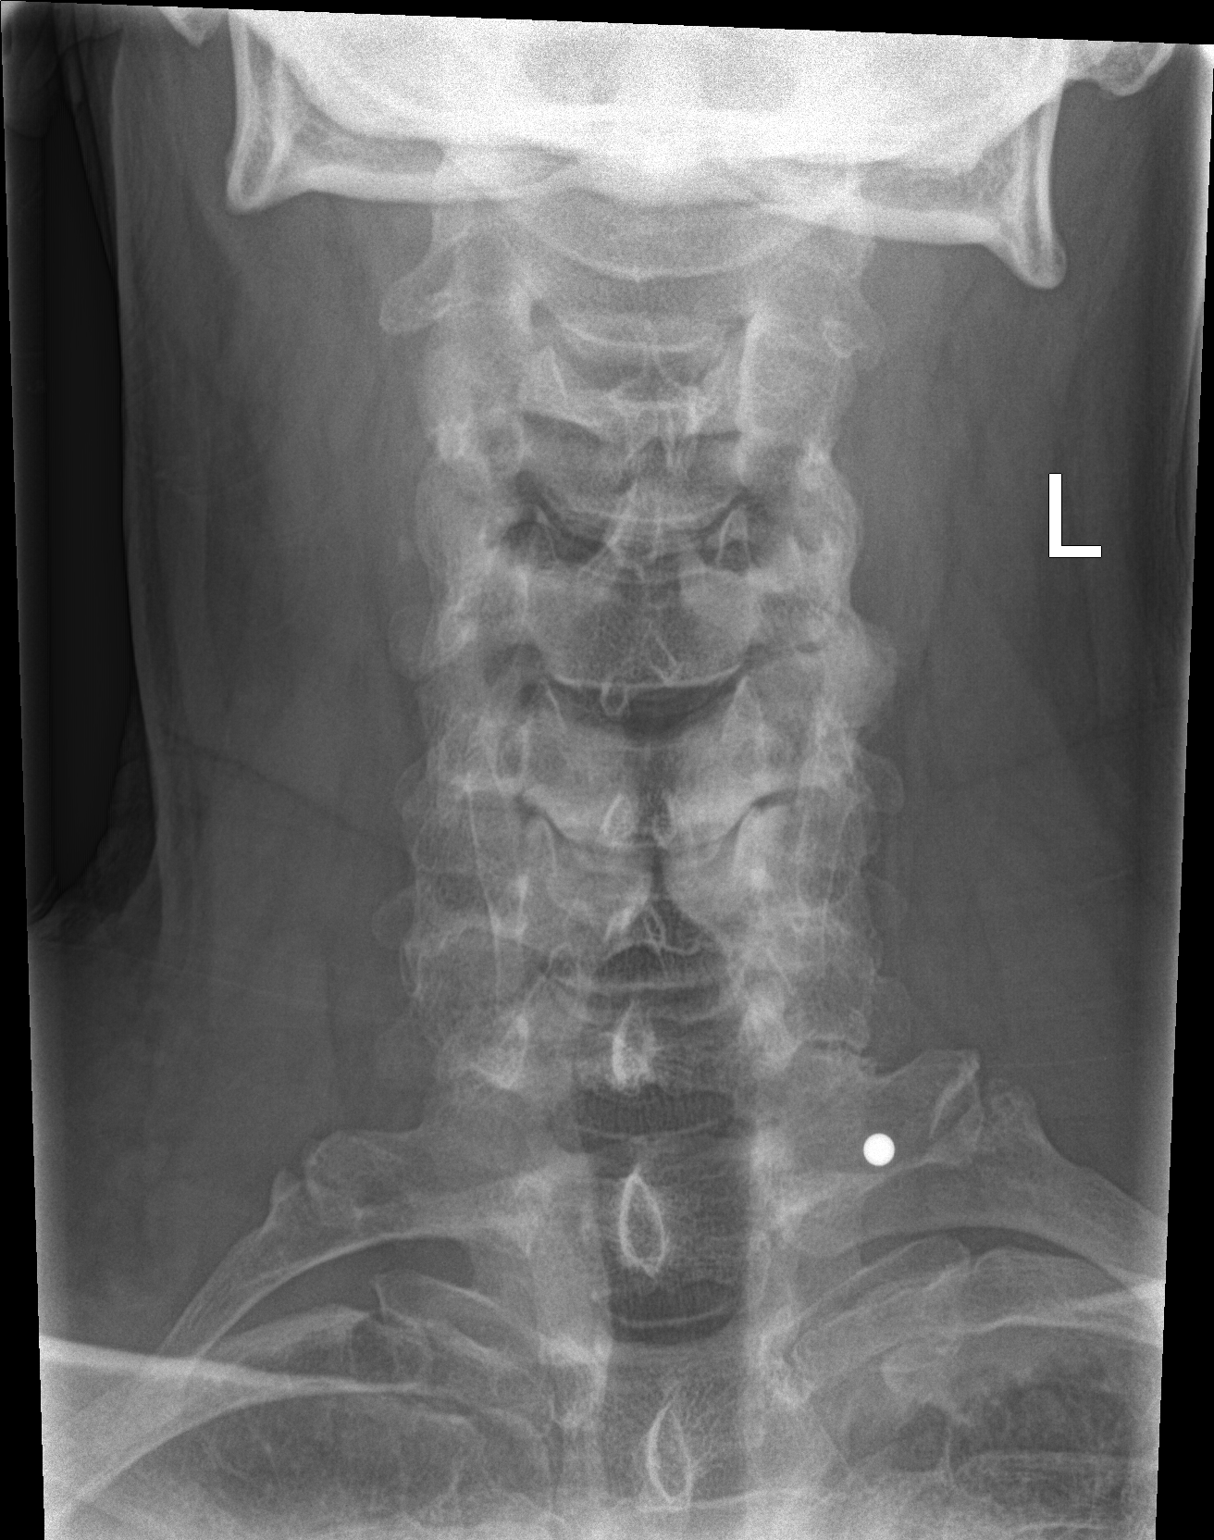

[c-spine open mouth]
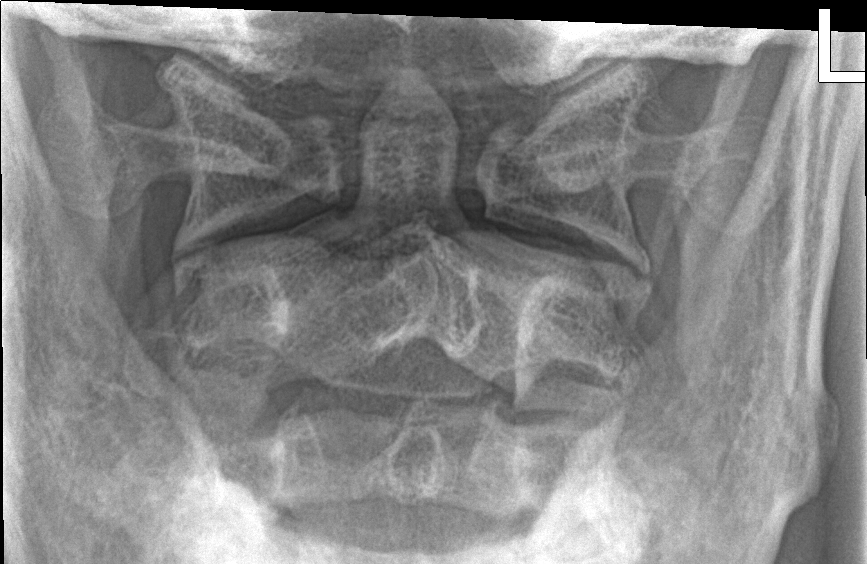

[3 of 3 positions shown; findings below may reference images not displayed]

FINDINGS: Degenerative changes C5 through C7 with loss of normal cervical
lordosis. No acute bony abnormality identified. No evidence of
fracture. Biapical pleural thickening noted most consistent
scarring.
IMPRESSION: Prominent degenerative changes C5 through C7 loss of normal cervical
lordosis. No acute abnormality identified .

## 2018-12-09 IMAGING — DX DG CHEST 2V
2 series · 2 of 2 positions shown · non-contrast
Comparison: 05/04/2014

CLINICAL DATA: Shortness of breath

EXAM:
CHEST  2 VIEW

[chest pa]
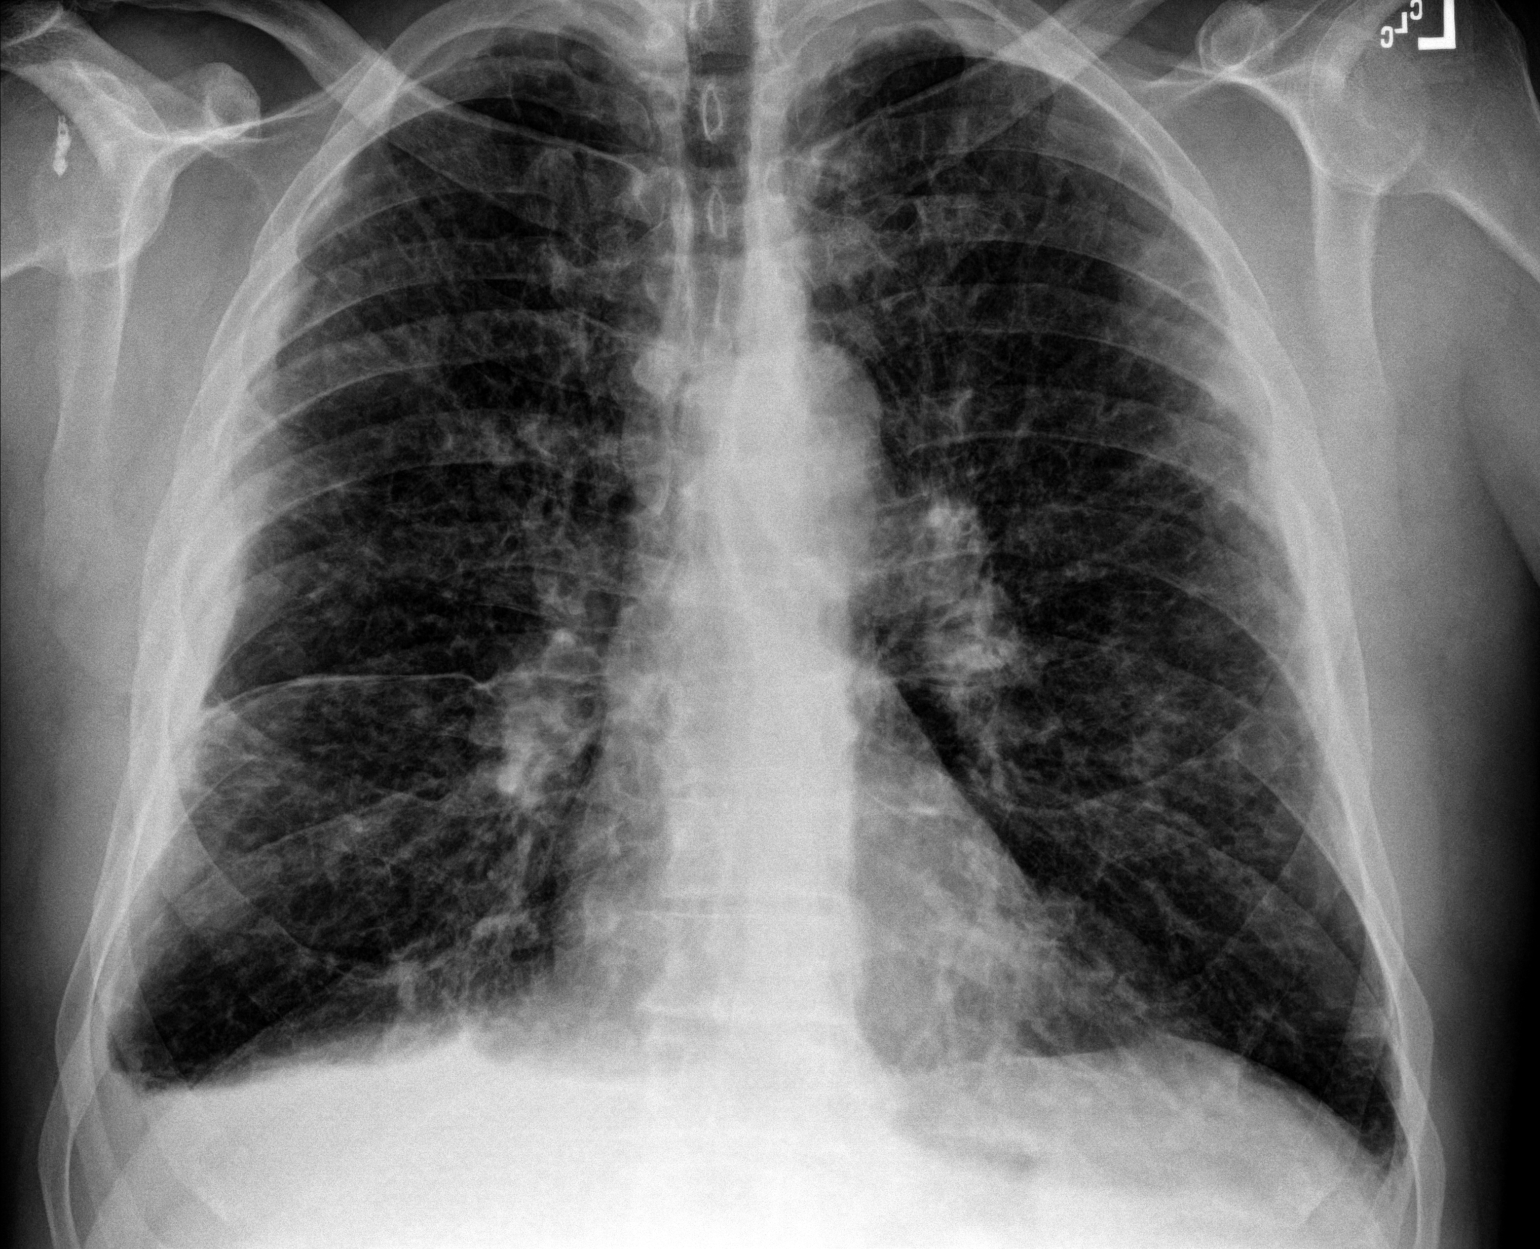

[chest lat]
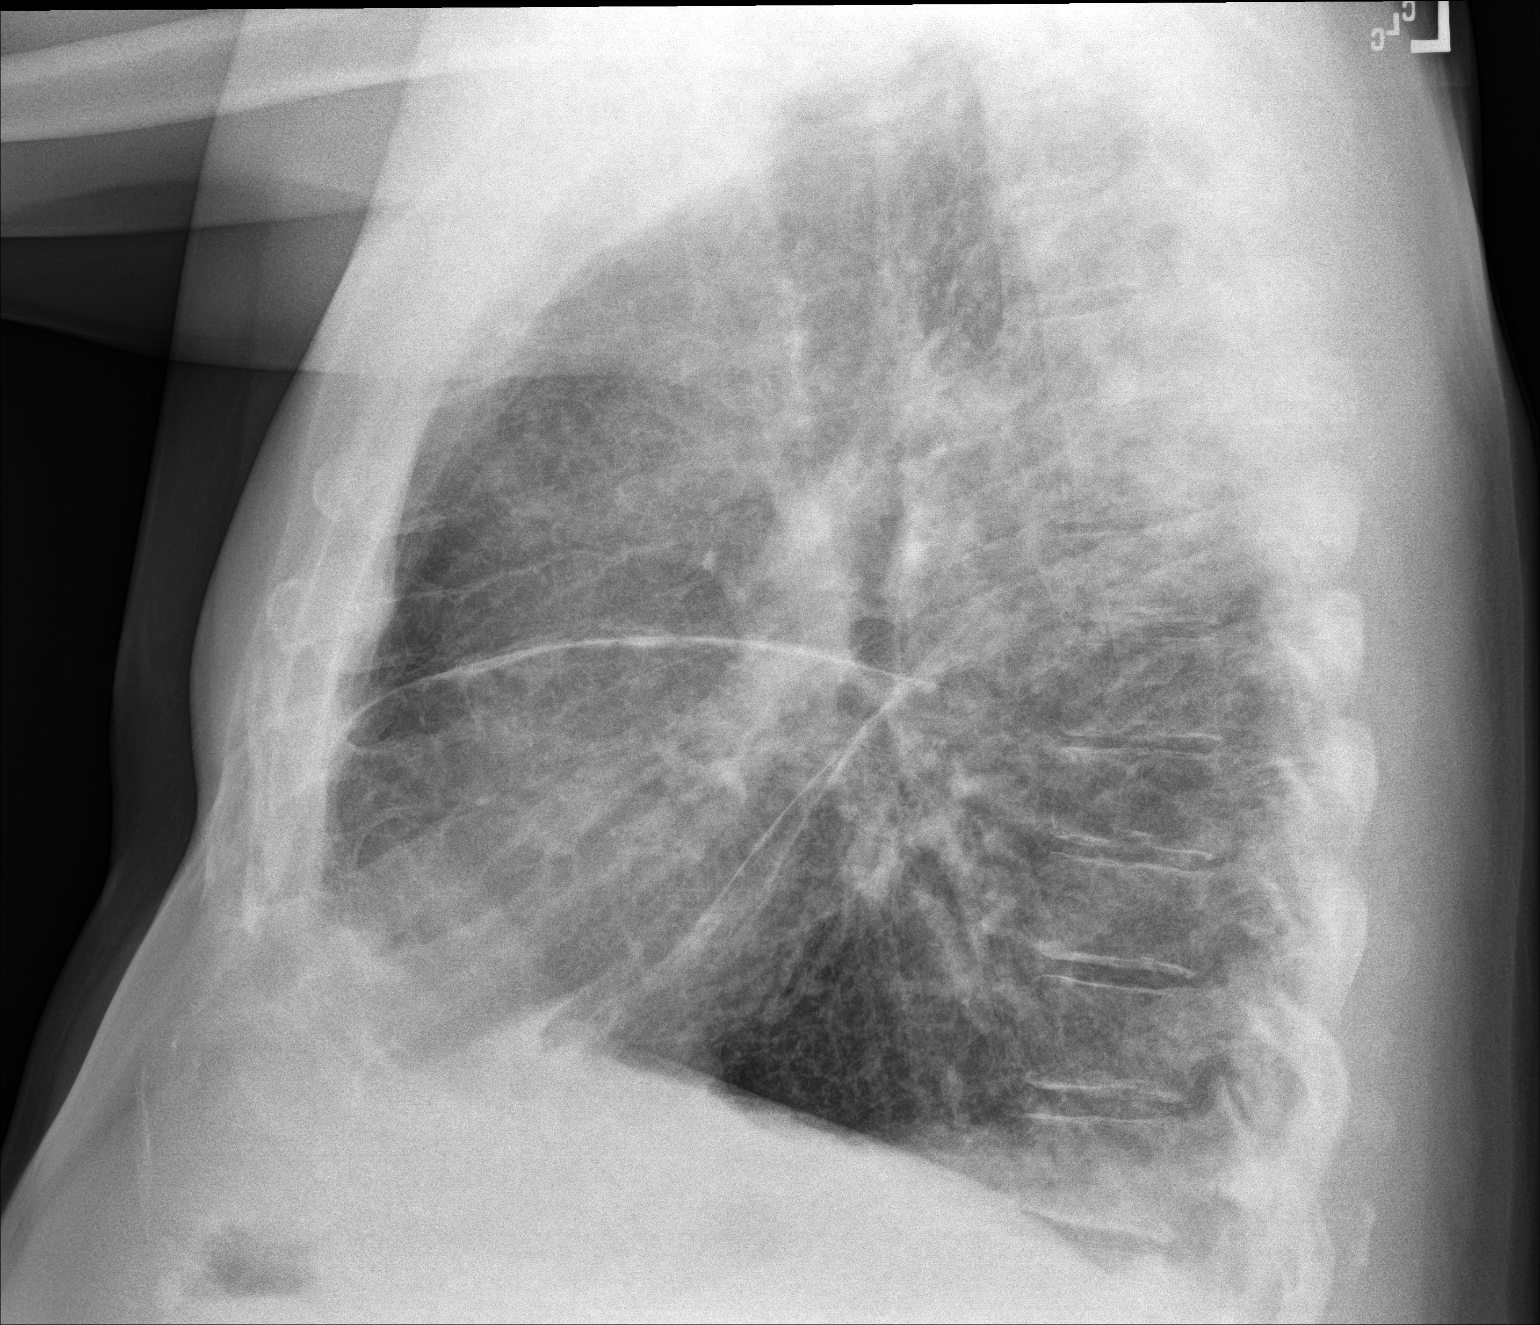

[2 of 2 positions shown; findings below may reference images not displayed]

FINDINGS: There is bilateral diffuse interstitial thickening likely reflecting
chronic interstitial lung disease which has progressed compared with
05/04/2014. There is no focal consolidation. There is no
pneumothorax. There is blunting of the right costophrenic angle
which may reflect scarring versus a small amount of pleural fluid.
There is no left pleural effusion. The heart and mediastinum are
stable.
IMPRESSION: Chronic interstitial lung disease which has progressed compared with
05/04/2014. If there is further clinical concern, recommend
evaluation with a high-resolution CT of the chest.

## 2019-02-18 DEATH — deceased
